# Patient Record
Sex: Female | Born: 1955
Health system: Southern US, Community
[De-identification: ages and names within clinical notes are randomized; demographics above are authoritative.]

## PROBLEM LIST (undated history)

## (undated) DIAGNOSIS — E079 Disorder of thyroid, unspecified: Secondary | ICD-10-CM

## (undated) HISTORY — PX: METATARSAL OSTEOTOMY WITH BUNIONECTOMY: SHX5662

## (undated) HISTORY — PX: TUBAL LIGATION: SHX77

## (undated) HISTORY — PX: NASAL SINUS SURGERY: SHX719

## (undated) HISTORY — PX: NEURECTOMY FOOT: SUR888

## (undated) HISTORY — DX: Disorder of thyroid, unspecified: E07.9

## (undated) HISTORY — PX: LAPAROSCOPIC OOPHERECTOMY: SHX6507

## (undated) HISTORY — PX: APPENDECTOMY: SHX54

## (undated) HISTORY — PX: BREAST SURGERY: SHX581

## (undated) HISTORY — PX: AUGMENTATION MAMMAPLASTY: SUR837

## (undated) HISTORY — PX: HAMMER TOE SURGERY: SHX385

## (undated) HISTORY — PX: AIKEN OSTEOTOMY: SHX6331

---

## 2000-07-17 ENCOUNTER — Encounter: Admission: RE | Admit: 2000-07-17 | Discharge: 2000-07-17 | Payer: Self-pay | Admitting: Sports Medicine

## 2000-07-17 ENCOUNTER — Encounter: Payer: Self-pay | Admitting: Sports Medicine

## 2000-12-02 ENCOUNTER — Emergency Department (HOSPITAL_COMMUNITY): Admission: EM | Admit: 2000-12-02 | Discharge: 2000-12-02 | Payer: Self-pay | Admitting: *Deleted

## 2000-12-04 ENCOUNTER — Other Ambulatory Visit: Admission: RE | Admit: 2000-12-04 | Discharge: 2000-12-04 | Payer: Self-pay | Admitting: Family Medicine

## 2000-12-07 ENCOUNTER — Encounter: Payer: Self-pay | Admitting: Family Medicine

## 2000-12-07 ENCOUNTER — Ambulatory Visit (HOSPITAL_COMMUNITY): Admission: RE | Admit: 2000-12-07 | Discharge: 2000-12-07 | Payer: Self-pay | Admitting: Family Medicine

## 2001-02-02 ENCOUNTER — Ambulatory Visit (HOSPITAL_BASED_OUTPATIENT_CLINIC_OR_DEPARTMENT_OTHER): Admission: RE | Admit: 2001-02-02 | Discharge: 2001-02-02 | Payer: Self-pay | Admitting: Orthopedic Surgery

## 2002-02-11 ENCOUNTER — Other Ambulatory Visit: Admission: RE | Admit: 2002-02-11 | Discharge: 2002-02-11 | Payer: Self-pay | Admitting: Family Medicine

## 2002-02-15 ENCOUNTER — Ambulatory Visit (HOSPITAL_COMMUNITY): Admission: RE | Admit: 2002-02-15 | Discharge: 2002-02-15 | Payer: Self-pay | Admitting: Family Medicine

## 2002-02-15 ENCOUNTER — Encounter: Payer: Self-pay | Admitting: Family Medicine

## 2002-09-20 ENCOUNTER — Encounter: Payer: Self-pay | Admitting: Family Medicine

## 2002-09-20 ENCOUNTER — Encounter: Admission: RE | Admit: 2002-09-20 | Discharge: 2002-09-20 | Payer: Self-pay | Admitting: Family Medicine

## 2002-09-30 ENCOUNTER — Encounter (INDEPENDENT_AMBULATORY_CARE_PROVIDER_SITE_OTHER): Payer: Self-pay | Admitting: Specialist

## 2002-09-30 ENCOUNTER — Ambulatory Visit (HOSPITAL_COMMUNITY): Admission: RE | Admit: 2002-09-30 | Discharge: 2002-09-30 | Payer: Self-pay | Admitting: Obstetrics and Gynecology

## 2002-10-10 ENCOUNTER — Encounter: Payer: Self-pay | Admitting: Obstetrics and Gynecology

## 2002-10-10 ENCOUNTER — Ambulatory Visit (HOSPITAL_COMMUNITY): Admission: RE | Admit: 2002-10-10 | Discharge: 2002-10-10 | Payer: Self-pay | Admitting: Obstetrics and Gynecology

## 2003-11-08 ENCOUNTER — Emergency Department (HOSPITAL_COMMUNITY): Admission: AD | Admit: 2003-11-08 | Discharge: 2003-11-08 | Payer: Self-pay | Admitting: Family Medicine

## 2003-11-17 ENCOUNTER — Encounter: Admission: RE | Admit: 2003-11-17 | Discharge: 2003-11-17 | Payer: Self-pay | Admitting: Family Medicine

## 2004-09-07 ENCOUNTER — Other Ambulatory Visit: Admission: RE | Admit: 2004-09-07 | Discharge: 2004-09-07 | Payer: Self-pay | Admitting: Family Medicine

## 2005-02-11 ENCOUNTER — Other Ambulatory Visit: Admission: RE | Admit: 2005-02-11 | Discharge: 2005-02-11 | Payer: Self-pay | Admitting: Family Medicine

## 2005-05-21 ENCOUNTER — Emergency Department (HOSPITAL_COMMUNITY): Admission: EM | Admit: 2005-05-21 | Discharge: 2005-05-21 | Payer: Self-pay | Admitting: Family Medicine

## 2005-07-02 ENCOUNTER — Emergency Department (HOSPITAL_COMMUNITY): Admission: EM | Admit: 2005-07-02 | Discharge: 2005-07-02 | Payer: Self-pay | Admitting: Emergency Medicine

## 2005-09-19 ENCOUNTER — Ambulatory Visit (HOSPITAL_COMMUNITY): Admission: RE | Admit: 2005-09-19 | Discharge: 2005-09-19 | Payer: Self-pay | Admitting: Gastroenterology

## 2006-04-11 ENCOUNTER — Other Ambulatory Visit: Admission: RE | Admit: 2006-04-11 | Discharge: 2006-04-11 | Payer: Self-pay | Admitting: Family Medicine

## 2007-01-24 ENCOUNTER — Encounter: Admission: RE | Admit: 2007-01-24 | Discharge: 2007-01-24 | Payer: Self-pay | Admitting: Family Medicine

## 2007-06-28 ENCOUNTER — Other Ambulatory Visit: Admission: RE | Admit: 2007-06-28 | Discharge: 2007-06-28 | Payer: Self-pay | Admitting: Family Medicine

## 2008-08-21 ENCOUNTER — Other Ambulatory Visit: Admission: RE | Admit: 2008-08-21 | Discharge: 2008-08-21 | Payer: Self-pay | Admitting: Family Medicine

## 2009-02-17 ENCOUNTER — Encounter: Admission: RE | Admit: 2009-02-17 | Discharge: 2009-02-17 | Payer: Self-pay | Admitting: Otolaryngology

## 2011-04-29 NOTE — H&P (Signed)
NAME:  Jane [name], Jane [name]                        ACCOUNT NO.:  [id number]   MEDICAL RECORD NO.:  [id number]                   PATIENT TYPE:  AMB   LOCATION:  SDC                                  FACILITY:  [facility]   PHYSICIAN:  Rawleigh Rode DICTATOR                    DATE OF BIRTH:  05/13/1956   DATE OF ADMISSION:  09/30/2002  DATE OF DISCHARGE:                                HISTORY & PHYSICAL   HISTORY OF PRESENT ILLNESS:  This is [name] 55 year old, gravida 3, para 2,  abortus 1, who presented on September 24, 2002, complaining of left lower  quadrant discomfort of greater than two years' duration. She has been  followed for 20 years with [name] diagnosis of irritable bowel syndrome. For the  last two years, she has been followed by her family practice physician to  which she has complained about intermittent left lower quadrant discomfort.  She has had two ultrasounds in the past two years and brought copies of each  to me.  The pain she describes is intermittent in nature and correlates  perhaps to both ovulation and to her menstrual period. She has minimal  dysmenorrhea.  She denies any dyspareunia or history of infertility.  She  describes the pain specifically in her left lower quadrant and points to her  inguinal area.  She states that this is sufficiently debilitating to warrant  surgical intervention.   MEDICATIONS:  Lexapro.   PAST MEDICAL HISTORY:  Negative.   PAST SURGICAL HISTORY:  Appendectomy in 1987.  Sinus surgery in 1993.  Tubal  ligation in 1991.  Breast augmentation in 1995.  Achilles repair in 2002.   ALLERGIES:  SULFA.   FAMILY HISTORY:  Positive for lung cancer and [name] brain tumor.   SOCIAL HISTORY:  The patient denies the use of tobacco. She drinks alcohol  socially.  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  She has some complaints of  depression as well.   PHYSICAL EXAMINATION:  GENERAL:  [name] well-developed, well-nourished, pleasant  white female in no acute distress.  VITAL  SIGNS:  Afebrile, vital signs stable.  SKIN: Warm and dry without lesions.  LYMPHS: There is no supraclavicular, cervical, or inguinal adenopathy.  HEENT: Normocephalic.  NECK:  Supple without thyromegaly.  CHEST: Lungs are clear.  HEART:  Regular rate and rhythm without murmurs, rubs, or gallops.  RECTAL:  Deferred.  ABDOMEN:  Soft and nontender without masses or organomegaly.  Bowel sounds  are active.  MUSCULOSKELETAL:  Full range of motion without edema, cyanosis, or  tenderness.  PELVIC: External genitalia is within normal limits. Vagina and cervix  without gross lesions.  Bimanual examination reveals the uterus to be  approximately 9 x 5 x 4 cm and no adnexal masses are palpable. There is  definite point tenderness in the area of the left uterine cornu.  I can  appreciate no obvious adnexal masses.  RECTOVAGINAL: Confirms.   Ultrasound was performed on September 26, 2002, and revealed [name] 1.2 cm echo-  free, smoothly marginated cyst of the right adnexa.  The uterus itself was  noted to be approximately 8 x 5 x 5 cm.  GC and Chlamydia cultures were  negative. CBC revealed [name] white blood cell count of 6.7.  Hemoglobin was  13.6.  Sed rate was 6.   IMPRESSION:  Left lower quadrant discomfort with point tenderness in the  vicinity of the left uterine cornu.  Differential includes fibroid,  endometriosis, adnexal mass, primary GI.  Doubt musculoskeletal, hernia, or  neoplasm.   PLAN:  Diagnostic/operative laparoscopy.  Risks, benefits, complications,  and alternatives were fully discussed with the patient.  Possibility of  obtaining pre GI consultation discussed and declined.  Possibility of  performing unilateral salpingo-oophorectomy discussed and accepted.  Possibility of exploratory laparotomy discussed and accepted.  Questions  invited and answered.                                               Jane [name] DICTATOR    DD/MEDQ  D:  09/29/2002  T:  09/29/2002  Job:  [id number]

## 2011-04-29 NOTE — Op Note (Signed)
[facility]  Patient:    [name], [name]                       MRN: [id number] Proc. Date: 02/02/01 Adm. Date:  [id number] Attending:  Colbert Ewing                           Operative Report  PREOPERATIVE DIAGNOSES: 1. Right heel chronic Achilles tendinitis with partial tearing and detachment. 2. Prominent posterior superior os calcis with pre-Achilles bursitis.  POSTOPERATIVE DIAGNOSES: 1. Right heel chronic Achilles tendinitis with partial tearing and detachment. 2. Prominent posterior superior os calcis with pre-Achilles bursitis.  PROCEDURES: 1. Exploration, debridement, repair Achilles tendon. 2. Removal posterior superior os calcis spur at pre-Achilles bursa.  SURGEON:  Loreta Ave, M.D.  ASSISTANT:  Arlys John D. Petrarca, P.A.-C.  ANESTHESIA:  General.  ESTIMATED BLOOD LOSS:  Minimal.  TOURNIQUET TIME:  45 minutes.  SPECIMENS:  None.  CULTURES:  None.  COMPLICATIONS:  None.  DRESSING:  Soft compressive with bulky dressing and splint.  DESCRIPTION OF PROCEDURE:  Patient brought to the operating room and placed on the operating table in supine position.  After adequate anesthesia had been obtained, turned to a prone position, tourniquet applied upper aspect of the right leg.  Prepped and draped in the usual sterile fashion.  Exsanguinated with elevation and Esmarch, tourniquet inflated to 300 mmHg.  Straight incision along the lateral aspect of the Achilles tendon near the attachment, extending down along the lateral wall of the os calcis.  Skin and subcutaneous tissue divided.  The Achilles tendon exposed.  Markedly thickened, chronically inflamed peritenon over the distal 2 cm of tendon, all of this debrided back to healthy tissue.  Evidence of intrasubstance partial tearing near the attachment on the lateral half of the tendon.  The tendon was opened longitudinally.  All of the areas of mucinous degeneration and  the area of partial tearing debrided back to healthy tissue.  More than 80% of the Achilles attachment still maintained.  As soon as the tendon was incised longitudinally, I entered into a very swollen, inflamed pre-Achilles bursa. This was excised in its entirety.  Accessing through the tendon defect and lateral to it, I removed all of the prominent posterior superior os calcis spur back to a nice, smooth border, confirmed with fluoroscopic guidance.  The wound was then irrigated.  We were able to repair the Achilles with side-to-side approximation with Vicryl, maintaining good longitudinal continuity and bypassing the area of partial tearing for repair.  I could bring her to a plantargrade position easily without undue tension.  The skin and subcutaneous tissue were closed with Vicryl and nylon.  Margins of the wound were injected with Marcaine.  Sterile compressive dressing and splint applied.  Returned to supine position after tourniquet deflated and removed. Anesthesia reversed, brought to recovery room.  Tolerated the surgery well, no complications. DD:  02/02/01 TD:  02/03/01 Job: [id number]

## 2011-04-29 NOTE — Op Note (Signed)
[name], NEU              ACCOUNT NO.:  [id number]   MEDICAL RECORD NO.:  [id number]          PATIENT TYPE:  AMB   LOCATION:  ENDO                         FACILITY:  [facility]   PHYSICIAN:  Petra Kuba, M.D.    DATE OF BIRTH:  [birth year]   DATE OF PROCEDURE:  09/19/2005  DATE OF DISCHARGE:                                 OPERATIVE REPORT   PROCEDURE:  Colonoscopy.   INDICATIONS:  Lower abdominal pain, abnormal CAT scan, family history of  colon polyps. Consent was signed after risks, benefits, methods, options  thoroughly discussed in the office.   MEDICINES USED:  Fentanyl 100 mcg, Versed 10 milligrams.   PROCEDURE:  Rectal inspection pertinent for external hemorrhoids, small  digital exam was negative. Video pediatric adjustable colonoscope was  inserted and with mild difficulty due to a tortuous long looping colon,  requiring various abdominal pressures, but no position changes, we were able  to advance to the cecum. No abnormalities were seen on insertion. The cecum  was identified by the appendiceal orifice and ileocecal valve.  In fact, the  scope was inserted a short ways in the terminal ileum which was normal.  Photodocumentation was obtained. Scope was slowly withdrawn. Prep was  adequate. There was minimal liquid stool that required washing and  suctioning.  Slow withdrawal through the colon, no abnormalities were seen,  specifically no polyps, tumors, masses or diverticula. Once back in the  rectum, anorectal pull-through and retroflexion confirmed some small  hemorrhoids. Scope was straightened and readvanced short ways up the left  side of the colon.  Air was suctioned, scope removed. The patient tolerated  the procedure well. There was no obvious immediate complication.   ENDOSCOPIC DIAGNOSES:  1.  Internal-external small hemorrhoids.  2.  Tortuous long looping colon.  3.  Otherwise within normal limits to the terminal ileum.   PLAN:  Recheck colon  screening in 5 years. Continue workup with an EGD for  her longstanding belching and upper tract symptoms.  Might consider a small  bowel follow-through next.  Will try some Robinul since the Librax helps but  makes her too tired and also consider a laparoscopy and lysis of adhesions,  most likely cause of pain.           ______________________________  Petra Kuba, M.D.     MEM/MEDQ  D:  09/19/2005  T:  09/19/2005  Job:  [id number]   cc:   Fayrene Fearing A. Ashley Royalty, M.D.  Fax: [phone]   Quita Skye. Artis Flock, M.D.  Fax: [phone]

## 2011-04-29 NOTE — Op Note (Signed)
[name], [name]              ACCOUNT NO.:  [id number]   MEDICAL RECORD NO.:  [id number]          PATIENT TYPE:  AMB   LOCATION:  ENDO                         FACILITY:  [facility]   PHYSICIAN:  Petra Kuba, M.D.    DATE OF BIRTH:  1956-11-14   DATE OF PROCEDURE:  09/19/2005  DATE OF DISCHARGE:                                 OPERATIVE REPORT   PROCEDURE:  EGD.   INDICATIONS:  Upper tract symptoms longstanding, family history of  Barrett's.  Consent was signed after risks, benefits, methods, options  thoroughly discussed multiple times in the past.   ADDITIONAL MEDICINES FOR THIS PROCEDURE:  2 mg Versed only.   PROCEDURE:  The video endoscope was inserted by direct vision. She did have  a small hiatal hernia with some minimal distal inflammation but no signs of  Barrett's.  Scope passed in the stomach advanced through a normal antrum,  normal pylorus, into a normal duodenal bulb and around the C-loop to a  normal second portion of the duodenum. Scope was withdrawn back to the bulb  and a good look there ruled out abnormalities in that location.  The scope  was withdrawn back the stomach and retroflexed.  High in the cardia, hiatal  hernia was confirmed. Angularis, fundus, lesser and greater curve were  normal on retroflexed visualization. Straight visualization of the stomach  did not reveal any additional findings. The air was suctioned, scope slowly  withdrawn.  Again a good look at the esophagus was normal except for some  minimal distal inflammation.  Scope was removed. The patient tolerated the  procedure well. There was no obvious immediate complication.   1.  Small hiatal hernia with minimal distal inflammation.  2.  Otherwise normal EGD.   PLAN:  Pump inhibitors p.r.n. as she is doing is fine.  Consider small-bowel  follow-through next or laparoscope. Please see colon dictation for other  recommendations.  Happy to see back p.r.n. or in 6 weeks to recheck   symptoms.   September 18, 2005           ______________________________  Petra Kuba, M.D.     MEM/MEDQ  D:  09/19/2005  T:  09/19/2005  Job:  [id number]   cc:   Fayrene Fearing A. Ashley Royalty, M.D.  Fax: [phone]   Quita Skye. Artis Flock, M.D.  Fax: [phone]

## 2011-04-29 NOTE — Op Note (Signed)
NAME:  [name], [name]                        ACCOUNT NO.:  [id number]   MEDICAL RECORD NO.:  [id number]                   PATIENT TYPE:  AMB   LOCATION:  SDC                                  FACILITY:  [facility]   PHYSICIAN:  James A. Ashley Royalty, M.D.             DATE OF BIRTH:  November 21, 1956   DATE OF PROCEDURE:  09/30/2002  DATE OF DISCHARGE:                                 OPERATIVE REPORT   PREOPERATIVE DIAGNOSIS:  Left lower quadrant discomfort with point  tenderness in the vicinity of the left uterine cornu -- fibroid,  endometriosis, adnexal mass, primary gastrointestinal.  Doubt  musculoskeletal etiology, hernia, or neoplasm.   POSTOPERATIVE DIAGNOSES:  1. Left lower quadrant discomfort with point tenderness in the vicinity of     the left uterine cornu -- fibroid, endometriosis, adnexal mass, primary     gastrointestinal.  Doubt musculoskeletal etiology, hernia, or neoplasm.  2. Right ovarian cyst of 1 cm in greatest diameter, probably physiologic.  3. Adhesions of the left adnexa to the left pelvic sidewall.   PROCEDURE:  1. Diagnostic/operative laparoscopy.  2. Left salpingo-oophorectomy.   SURGEON:  Rudy Jew. Ashley Royalty, M.D.   ANESTHESIA:  General.   ESTIMATED BLOOD LOSS:  Less than 25 cc.   COMPLICATIONS:  None.   PACKS AND DRAINS:  None.   DESCRIPTION OF PROCEDURE:  The patient was taken to the operating room and  placed in the dorsal supine position.  After adequate general endotracheal  anesthesia was administered, she was placed in the lithotomy position and  prepped and draped in the usual manner for abdominal surgery.  A posterior  weighted retractor was placed per vagina.  The anterior lip of the cervix  was grasped with a single-tooth tenaculum.  A Jelco uterine manipulator was  placed per cervix and held in place with the tenaculum.  The bladder was  drained completely with a red rubber catheter.   A 1.2 cm infraumbilical incision was made in the transverse  plane.  The  subcutaneous tissues were sharply and bluntly dissected down to the fascia,  which was elevated with hemostats and entered atraumatically with Metzenbaum  scissors.  The fascia was extended laterally.  The underlying peritoneum was  identified and elevated with hemostats.  It was entered atraumatically with  Metzenbaum scissors.  Next the open laparoscopic trocar was placed into the  abdominal cavity.  It was held in place with stay sutures of 0 Vicryl.  The  laparoscope was placed.  There was no evidence of any trauma.  Pneumoperitoneum was then created with CO2 and maintained throughout the  procedure.  Next a 5 mm trocar was placed in the left lower quadrant  suprapubically.  Transillumination and indirect visualization techniques  were employed for optimum placement.  The pelvis was thoroughly surveyed.  The uterus was normal size, shape, and contour without evidence of any  endometriosis or fibroids.  The fallopian tubes  were interrupted  bilaterally, consistent with the known tubal sterilization procedure.  The  right ovary was normal size, shape, and contour with an apparent 1 cm  physiologic cyst.  The left ovary was normal size, shape, and contour;  however, it was adherent to the left pelvic sidewall initially.   Next a 5 mm suprapubic trocar was placed in the right lower quadrant.  Transillumination and direct visualization techniques were used.  Cytologic  washings were obtained with the Nezhat suction irrigator and submitted to  pathology for cytologic studies.  The remainder of the pelvis survey was  conducted.  Appropriate photographs were obtained.  The peritoneal surfaces  were smooth and glistening.  There were no inappropriate adhesions other  than those from the left adnexa to the left pelvic sidewall.   In view of the patient's intense discomfort in the vicinity of the left  adnexal area, the decision was made to proceed with left salpingo-   oophorectomy.  The tripolar cautery was introduced and the left adnexa was  released from its vascular supply.  The ureter was noted to be well below  the plane of dissection.  The tripolar cautery was employed, taking serial  bites of the mesosalpinx and proximal fallopian tube.  Ultimately the  specimen was delivered successfully and placed into the cul-de-sac for later  retrieval.  Hemostasis was noted.  Appropriate __________ were cleaned.  Next the left inferior port was extended to a 1 cm diameter.  The 10 mm  trocar was placed successfully through this incision using direct  visualization.  The EndoCatch was placed into the abdominal cavity and the  specimen placed in the bag.  The specimen was brought out through the  inferior incision intact in the bag.  The incision had to be extended  slightly in order to accomplish successful delivery of the specimen.  The  left lower quadrant incision's fascia was then closed with 0 Vicryl in a  running fashion.  The superior incision was closed at the level of the  fascia with 0 Vicryl in an interrupted fashion.  The right inferior incision  was closed with 0 Vicryl in an interrupted fashion.  The skin of all three  incisions was closed with 3-0 chromic in subcuticular fashion.  Hemostasis  was noted.  The vaginal instruments were removed, hemostasis was noted, and  the procedure terminated.   The patient tolerated the procedure fairly well and was returned to the  recovery room in good condition.                                                    James A. Ashley Royalty, M.D.    JAM/MEDQ  D:  09/30/2002  T:  09/30/2002  Job:  [id number]

## 2011-11-08 ENCOUNTER — Other Ambulatory Visit (HOSPITAL_COMMUNITY)
Admission: RE | Admit: 2011-11-08 | Discharge: 2011-11-08 | Disposition: A | Discharge status: Home / Self Care | Payer: BC Managed Care – PPO | Source: Ambulatory Visit | Attending: Obstetrics and Gynecology | Admitting: Obstetrics and Gynecology

## 2011-11-08 DIAGNOSIS — Z01419 Encounter for gynecological examination (general) (routine) without abnormal findings: Secondary | ICD-10-CM | POA: Insufficient documentation

## 2012-04-25 ENCOUNTER — Encounter: Payer: Self-pay | Admitting: Sports Medicine

## 2012-05-23 ENCOUNTER — Encounter: Payer: Self-pay | Admitting: Sports Medicine

## 2012-05-23 ENCOUNTER — Ambulatory Visit (INDEPENDENT_AMBULATORY_CARE_PROVIDER_SITE_OTHER): Payer: BC Managed Care – PPO | Admitting: Sports Medicine

## 2012-05-23 VITALS — BP 116/75 | HR 43 | Ht 66.0 in | Wt 130.0 lb

## 2012-05-23 DIAGNOSIS — G5750 Tarsal tunnel syndrome, unspecified lower limb: Secondary | ICD-10-CM

## 2012-05-23 DIAGNOSIS — M21611 Bunion of right foot: Secondary | ICD-10-CM | POA: Insufficient documentation

## 2012-05-23 DIAGNOSIS — M21619 Bunion of unspecified foot: Secondary | ICD-10-CM

## 2012-05-23 HISTORY — DX: Tarsal tunnel syndrome, unspecified lower limb: G57.50

## 2012-05-23 HISTORY — DX: Bunion of right foot: M21.611

## 2012-05-23 MED ORDER — GABAPENTIN 300 MG PO CAPS: 300.0000 mg | ORAL_CAPSULE | Freq: Every day | ORAL | Status: DC

## 2012-05-23 NOTE — Assessment & Plan Note (Signed)
I does see any evidence of Morton's neuroma  I do see ultrasound evidence of tarsal tunnel and she did have a positive exam for this  arch support seems to really help  We added a metatarsal pad to her current sandals which felt better  We will start her on gabapentin at nighttime and recheck in 1 month

## 2012-05-23 NOTE — Patient Instructions (Signed)
Take gabapentin 1 tab at bed time.  We have plenty of room to increase the dose.  Bring your other shoes to your next appointment for padding/help with this foot pain.  Come back in about 1 month.

## 2012-05-23 NOTE — Progress Notes (Signed)
Patient ID: Jillyn Hidden, female   DOB: 22-Jan-1956, 56 y.o.   MRN: [id number]  HPI: Patient of Dr. Margaretha Sheffield diagnosed with Morton's neuroma here for orthotics today.  Saw Draper 6 weeks ago.  Painful right big toe joint for years.  Was getting injections ~ 1x/yr years ago.  This spring the pain became very bad in bottom of lateral 3 toes.  Since appt with Margaretha Sheffield, had pedicure and big toe was manipulated and has been very painful since that time.  + numbness/tinlging over plantar aspect of lateral 3 toes on right, sometimes entire bottom of right foot.    H/o R bunionectomy 11 years ago.    PE: Filed Vitals:   05/23/12 1045  BP: 116/75  Pulse: 43   Gen: NAD, pleasant  Right foot: + tenderness over right great toe +MTB bossing Collapsed arch, right worse than left + hammertoes (right 4th and 5th toes) + Tinel's sign over right tarsal tunnel  Neg squeeze test Neg quarter test + Mulder's only at 4th/5th interspace  [facility] ultrasound Able to identify digital nerves between the interspaces of metatarsals 2 through 5 All of these nerves are of normal caliber and there is no sign of a neuroma The tibial nerve in the tarsal tunnel is thickened with a volume of 0.14 cm2

## 2012-06-26 ENCOUNTER — Ambulatory Visit: Payer: BC Managed Care – PPO | Admitting: Sports Medicine

## 2012-08-23 ENCOUNTER — Other Ambulatory Visit: Payer: Self-pay | Admitting: Dermatology

## 2012-11-12 ENCOUNTER — Other Ambulatory Visit (HOSPITAL_COMMUNITY)
Admission: RE | Admit: 2012-11-12 | Discharge: 2012-11-12 | Disposition: A | Discharge status: Home / Self Care | Payer: BC Managed Care – PPO | Source: Ambulatory Visit | Attending: Obstetrics and Gynecology | Admitting: Obstetrics and Gynecology

## 2012-11-12 ENCOUNTER — Other Ambulatory Visit: Payer: Self-pay | Admitting: Obstetrics and Gynecology

## 2012-11-12 DIAGNOSIS — Z01419 Encounter for gynecological examination (general) (routine) without abnormal findings: Secondary | ICD-10-CM | POA: Insufficient documentation

## 2013-04-04 ENCOUNTER — Ambulatory Visit (INDEPENDENT_AMBULATORY_CARE_PROVIDER_SITE_OTHER): Payer: BC Managed Care – PPO | Admitting: Psychology

## 2013-04-04 DIAGNOSIS — F331 Major depressive disorder, recurrent, moderate: Secondary | ICD-10-CM

## 2013-04-18 ENCOUNTER — Ambulatory Visit (INDEPENDENT_AMBULATORY_CARE_PROVIDER_SITE_OTHER): Payer: BC Managed Care – PPO | Admitting: Psychology

## 2013-04-18 DIAGNOSIS — F331 Major depressive disorder, recurrent, moderate: Secondary | ICD-10-CM

## 2013-05-02 ENCOUNTER — Ambulatory Visit (INDEPENDENT_AMBULATORY_CARE_PROVIDER_SITE_OTHER): Payer: BC Managed Care – PPO | Admitting: Psychology

## 2013-05-02 DIAGNOSIS — F331 Major depressive disorder, recurrent, moderate: Secondary | ICD-10-CM

## 2013-05-15 ENCOUNTER — Ambulatory Visit: Payer: BC Managed Care – PPO | Admitting: Psychology

## 2013-05-16 ENCOUNTER — Other Ambulatory Visit: Payer: Self-pay | Admitting: Dermatology

## 2013-05-27 ENCOUNTER — Ambulatory Visit (INDEPENDENT_AMBULATORY_CARE_PROVIDER_SITE_OTHER): Payer: BC Managed Care – PPO | Admitting: Psychology

## 2013-05-27 DIAGNOSIS — F331 Major depressive disorder, recurrent, moderate: Secondary | ICD-10-CM

## 2013-06-24 ENCOUNTER — Ambulatory Visit (INDEPENDENT_AMBULATORY_CARE_PROVIDER_SITE_OTHER): Payer: BC Managed Care – PPO | Admitting: Psychology

## 2013-06-24 DIAGNOSIS — F331 Major depressive disorder, recurrent, moderate: Secondary | ICD-10-CM

## 2013-07-12 ENCOUNTER — Ambulatory Visit (INDEPENDENT_AMBULATORY_CARE_PROVIDER_SITE_OTHER): Payer: BC Managed Care – PPO | Admitting: Psychology

## 2013-07-12 DIAGNOSIS — F331 Major depressive disorder, recurrent, moderate: Secondary | ICD-10-CM

## 2013-08-02 ENCOUNTER — Ambulatory Visit (INDEPENDENT_AMBULATORY_CARE_PROVIDER_SITE_OTHER): Payer: BC Managed Care – PPO | Admitting: Psychology

## 2013-08-02 DIAGNOSIS — F331 Major depressive disorder, recurrent, moderate: Secondary | ICD-10-CM

## 2013-12-25 ENCOUNTER — Other Ambulatory Visit: Payer: Self-pay | Admitting: Dermatology

## 2014-03-20 ENCOUNTER — Ambulatory Visit (INDEPENDENT_AMBULATORY_CARE_PROVIDER_SITE_OTHER): Payer: BC Managed Care – PPO

## 2014-03-20 ENCOUNTER — Encounter: Payer: Self-pay | Admitting: Podiatry

## 2014-03-20 ENCOUNTER — Ambulatory Visit (INDEPENDENT_AMBULATORY_CARE_PROVIDER_SITE_OTHER): Payer: BC Managed Care – PPO | Admitting: Podiatry

## 2014-03-20 VITALS — BP 107/72 | HR 45 | Resp 16 | Ht 66.0 in | Wt 132.0 lb

## 2014-03-20 DIAGNOSIS — G5760 Lesion of plantar nerve, unspecified lower limb: Secondary | ICD-10-CM

## 2014-03-20 DIAGNOSIS — M2010 Hallux valgus (acquired), unspecified foot: Secondary | ICD-10-CM

## 2014-03-20 DIAGNOSIS — M779 Enthesopathy, unspecified: Secondary | ICD-10-CM

## 2014-03-20 DIAGNOSIS — M7660 Achilles tendinitis, unspecified leg: Secondary | ICD-10-CM

## 2014-03-20 MED ORDER — TRIAMCINOLONE ACETONIDE 10 MG/ML IJ SUSP
10.0000 mg | Freq: Once | INTRAMUSCULAR | Status: AC
  Administered 2014-03-20: 10 mg

## 2014-03-20 NOTE — Progress Notes (Signed)
Subjective:     Patient ID: [name], female   DOB: 28-Nov-1956, 58 y.o.   MRN: [id number]  Foot Pain  Toe Pain    patient presents with pain in the forefoot right and structural bunion that was fixed 12 years ago by Dr. Eulah PontMurphy that has returned and pain in the posterior aspect of the left heel which is intensified over the last few months. States she was diagnosed with tarsal tunnel syndrome but she's not sure of that right on her right foot and she feels like the toes hurt and it hurts in between the toes  Review of Systems  All other systems reviewed and are negative.      Objective:   Physical Exam  Nursing note and vitals reviewed. Constitutional: She is oriented to person, place, and time.  Cardiovascular: Intact distal pulses.   Musculoskeletal: Normal range of motion.  Neurological: She is oriented to person, place, and time.  Skin: Skin is warm.   neurovascular status found to be intact with muscle strength adequate and range of motion within normal limits. Negative Tinel's sign was noted and I didn't note there's discomfort in the lesser MPJs right keratotic lesion on the distal medial aspect second toe right deviation of the big toe against the second toe right hyperostosis with redness first metatarsal right and shooting pain third interspace right foot. 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 I'm focusing on 2 conditions today and did a careful injection of the lateral Achilles explaining risk first and then did a neural lysis injection of the right third interspace with a purified dehydrated alcohol solution and Marcaine. Patient  will test the right foot with different types of shoes and will be seen back again to reevaluate in 2 weeks and was given instructions to be very careful of the left foot and I did explain the risk of the procedure

## 2014-03-20 NOTE — Patient Instructions (Addendum)
Bunion (Hallux Valgus) A bony bump (protrusion) on the inside of the foot, at the base of the first toe, is called a bunion (hallux valgus). A bunion causes the first toe to angle toward the other toes. SYMPTOMS   A bony bump on the inside of the foot, causing an outward turning of the first toe. It may also overlap the second toe.  Thickening of the skin (callus) over the bony bump.  Fluid buildup under the callus. Fluid may become red, tender, and swollen (inflamed) with constant irritation or pressure.  Foot pain and stiffness. CAUSES  Many causes exist, including:  Inherited from your family (genetics).  Injury (trauma) forcing the first toe into a position in which it overlaps other toes.  Bunions are also associated with wearing shoes that have a narrow toe box (pointy shoes). RISK INCREASES WITH:  Family history of foot abnormalities, especially bunions.  Arthritis.  Narrow shoes, especially high heels. PREVENTION  Wear shoes with a wide toe box.  Avoid shoes with high heels.  Wear a small pad between the big toe and second toe.  Maintain proper conditioning:  Foot and ankle flexibility.  Muscle strength and endurance. PROGNOSIS  With proper treatment, bunions can typically be cured. Occasionally, surgery is required.  RELATED COMPLICATIONS   Infection of the bunion.  Arthritis of the first toe.  Risks of surgery, including infection, bleeding, injury to nerves (numb toe), recurrent bunion, overcorrection (toe points inward), arthritis of the big toe, big toe pointing upward, and bone not healing. TREATMENT  Treatment first consists of stopping the activities that aggravate the pain, taking pain medicines, and icing to reduce inflammation and pain. Wear shoes with a wide toe box. Shoes can be modified by a shoe repair person to relieve pressure on the bunion, especially if you cannot find shoes with a wide enough toe box. You may also place a pad with the  center cut out in your shoe, to reduce pressure on the bunion. Sometimes, an arch support (orthotic) may reduce pressure on the bunion and alleviate the symptoms. Stretching and strengthening exercises for the muscles of the foot may be useful. You may choose to wear a brace or pad at night to hold the big toe away from the second toe. If non-surgical treatments are not successful, surgery may be needed. Surgery involves removing the overgrown tissue and correcting the position of the first toe, by realigning the bones. Bunion surgery is typically performed on an outpatient basis, meaning you can go home the same day as surgery. The surgery may involve cutting the mid portion of the bone of the first toe, or just cutting and repairing (reconstructing) the ligaments and soft tissues around the first toe.  MEDICATION   If pain medicine is needed, nonsteroidal anti-inflammatory medicines, such as aspirin and ibuprofen, or other minor pain relievers, such as acetaminophen, are often recommended.  Do not take pain medicine for 7 days before surgery.  Prescription pain relievers are usually only prescribed after surgery. Use only as directed and only as much as you need.  Ointments applied to the skin may be helpful. HEAT AND COLD  Cold treatment (icing) relieves pain and reduces inflammation. Cold treatment should be applied for 10 to 15 minutes every 2 to 3 hours for inflammation and pain and immediately after any activity that aggravates your symptoms. Use ice packs or an ice massage.  Heat treatment may be used prior to performing the stretching and strengthening activities prescribed by your   caregiver, physical therapist, or athletic trainer. Use a heat pack or a warm soak. SEEK MEDICAL CARE IF:   Symptoms get worse or do not improve in 2 weeks, despite treatment.  After surgery, you develop fever, increasing pain, redness, swelling, drainage of fluids, bleeding, or increasing warmth around the  surgical area.  New, unexplained symptoms develop. (Drugs used in treatment may produce side effects.) Document Released: 11/28/2005 Document Revised: 02/20/2012 Document Reviewed: 03/12/2009 [facility] Patient Information 2014 [place], [place].  Hammer Toes Hammer toes is a condition in which one or more of your toes is permanently flexed. CAUSES  This happens when a muscle imbalance or abnormal bone length makes your small toes buckle. This causes the toe joint to contract and the strong cord-like bands that attach muscles to the bones (tendons) in your toes to shorten.  SIGNS AND SYMPTOMS  Common symptoms of flexible hammer toes include:   A build up of skin cells (Corns). Corns occur where boney bumps come in frequent contact with hard surfaces. For example, where your shoes press and rub.  Irritation.  Inflammation.  Pain.  Limited motion in your toes DIAGNOSIS  Hammer toes are diagnosed through a physical exam of your toes.During the exam, your health care provider may try to reproduce your symptoms by manipulating your foot. Often, x-ray exams are done to determine the degree of deformity and to make sure that the cause is not a fracture.  TREATMENT  Hammer toes can be treated with corrective surgery. There are several types of surgical procedures that can treat hammer toes. The most common procedures include:  Arthroplasty A portion of the joint is surgically removed and your toe is straightened. The gap fills in with fibrous tissue. This procedure helps treat pain and deformity and helps restore function.  Fusion Cartilage between the two bones of the affected joint is taken out and the bones fuse together into one longer bone. This helps keep your toe stable and reduces pain but leaves your toe stiff, yet straight.  Implantation A portion of your bone is removed and replaced with an implant to restore motion.  Flexor tendon transfers This procedure repositions the tendons  that curl the toes down (flexor tendons). This may be done to release the deforming force that causes your toe to buckle. Several of these procedures require fixing your toe with a pin that is visible at the tip of your toe. The pin keeps the toe straight during healing. Your health care provider will remove the pin usually within 4 8 weeks after the procedure.  Document Released: 11/25/2000 Document Revised: 09/18/2013 Document Reviewed: 08/05/2013 [facility] Patient Information 2014 [place], [place].   Achilles Tendinitis  with Rehab Achilles tendinitis is a disorder of the Achilles tendon. The Achilles tendon connects the large calf muscles (Gastrocnemius and Soleus) to the heel bone (calcaneus). This tendon is sometimes called the heel cord. It is important for pushing-off and standing on your toes and is important for walking, running, or jumping. Tendinitis is often caused by overuse and repetitive microtrauma. SYMPTOMS  Pain, tenderness, swelling, warmth, and redness may occur over the Achilles tendon even at rest.  Pain with pushing off, or flexing or extending the ankle.  Pain that is worsened after or during activity. CAUSES   Overuse sometimes seen with rapid increase in exercise programs or in sports requiring running and jumping.  Poor physical conditioning (strength and flexibility or endurance).  Running sports, especially training running down hills.  Inadequate warm-up before practice  or play or failure to stretch before participation.  Injury to the tendon. PREVENTION   Warm up and stretch before practice or competition.  Allow time for adequate rest and recovery between practices and competition.  Keep up conditioning.  Keep up ankle and leg flexibility.  Improve or keep muscle strength and endurance.  Improve cardiovascular fitness.  Use proper technique.  Use proper equipment (shoes, skates).  To help prevent recurrence, taping, protective strapping, or  an adhesive bandage may be recommended for several weeks after healing is complete. PROGNOSIS   Recovery may take weeks to several months to heal.  Longer recovery is expected if symptoms have been prolonged.  Recovery is usually quicker if the inflammation is due to a direct blow as compared with overuse or sudden strain. RELATED COMPLICATIONS   Healing time will be prolonged if the condition is not correctly treated. The injury must be given plenty of time to heal.  Symptoms can reoccur if activity is resumed too soon.  Untreated, tendinitis may increase the risk of tendon rupture requiring additional time for recovery and possibly surgery. TREATMENT   The first treatment consists of rest anti-inflammatory medication, and ice to relieve the pain.  Stretching and strengthening exercises after resolution of pain will likely help reduce the risk of recurrence. Referral to a physical therapist or athletic trainer for further evaluation and treatment may be helpful.  A walking boot or cast may be recommended to rest the Achilles tendon. This can help break the cycle of inflammation and microtrauma.  Arch supports (orthotics) may be prescribed or recommended by your caregiver as an adjunct to therapy and rest.  Surgery to remove the inflamed tendon lining or degenerated tendon tissue is rarely necessary and has shown less than predictable results. MEDICATION   Nonsteroidal anti-inflammatory medications, such as aspirin and ibuprofen, may be used for pain and inflammation relief. Do not take within 7 days before surgery. Take these as directed by your caregiver. Contact your caregiver immediately if any bleeding, stomach upset, or signs of allergic reaction occur. Other minor pain relievers, such as acetaminophen, may also be used.  Pain relievers may be prescribed as necessary by your caregiver. Do not take prescription pain medication for longer than 4 to 7 days. Use only as directed and  only as much as you need.  Cortisone injections are rarely indicated. Cortisone injections may weaken tendons and predispose to rupture. It is better to give the condition more time to heal than to use them. HEAT AND COLD  Cold is used to relieve pain and reduce inflammation for acute and chronic Achilles tendinitis. Cold should be applied for 10 to 15 minutes every 2 to 3 hours for inflammation and pain and immediately after any activity that aggravates your symptoms. Use ice packs or an ice massage.  Heat may be used before performing stretching and strengthening activities prescribed by your caregiver. Use a heat pack or a warm soak. SEEK MEDICAL CARE IF:  Symptoms get worse or do not improve in 2 weeks despite treatment.  New, unexplained symptoms develop. Drugs used in treatment may produce side effects.  EXERCISES:  RANGE OF MOTION (ROM) AND STRETCHING EXERCISES - Achilles Tendinitis  These exercises may help you when beginning to rehabilitate your injury. Your symptoms may resolve with or without further involvement from your physician, physical therapist or athletic trainer. While completing these exercises, remember:   Restoring tissue flexibility helps normal motion to return to the joints. This allows healthier, less  painful movement and activity.  An effective stretch should be held for at least 30 seconds.  A stretch should never be painful. You should only feel a gentle lengthening or release in the stretched tissue.  STRETCH  Gastroc, Standing   Place hands on wall.  Extend right / left leg, keeping the front knee somewhat bent.  Slightly point your toes inward on your back foot.  Keeping your right / left heel on the floor and your knee straight, shift your weight toward the wall, not allowing your back to arch.  You should feel a gentle stretch in the right / left calf. Hold this position for 10 seconds. Repeat 3 times. Complete this stretch 2 times per  day.  STRETCH  Soleus, Standing   Place hands on wall.  Extend right / left leg, keeping the other knee somewhat bent.  Slightly point your toes inward on your back foot.  Keep your right / left heel on the floor, bend your back knee, and slightly shift your weight over the back leg so that you feel a gentle stretch deep in your back calf.  Hold this position for 10 seconds. Repeat 3 times. Complete this stretch 2 times per day.  STRETCH  Gastrocsoleus, Standing  Note: This exercise can place a lot of stress on your foot and ankle. Please complete this exercise only if specifically instructed by your caregiver.   Place the ball of your right / left foot on a step, keeping your other foot firmly on the same step.  Hold on to the wall or a rail for balance.  Slowly lift your other foot, allowing your body weight to press your heel down over the edge of the step.  You should feel a stretch in your right / left calf.  Hold this position for 10 seconds.  Repeat this exercise with a slight bend in your knee. Repeat 3 times. Complete this stretch 2 times per day.   STRENGTHENING EXERCISES - Achilles Tendinitis These exercises may help you when beginning to rehabilitate your injury. They may resolve your symptoms with or without further involvement from your physician, physical therapist or athletic trainer. While completing these exercises, remember:   Muscles can gain both the endurance and the strength needed for everyday activities through controlled exercises.  Complete these exercises as instructed by your physician, physical therapist or athletic trainer. Progress the resistance and repetitions only as guided.  You may experience muscle soreness or fatigue, but the pain or discomfort you are trying to eliminate should never worsen during these exercises. If this pain does worsen, stop and make certain you are following the directions exactly. If the pain is still present after  adjustments, discontinue the exercise until you can discuss the trouble with your clinician.  STRENGTH - Plantar-flexors   Sit with your right / left leg extended. Holding onto both ends of a rubber exercise band/tubing, loop it around the ball of your foot. Keep a slight tension in the band.  Slowly push your toes away from you, pointing them downward.  Hold this position for 10 seconds. Return slowly, controlling the tension in the band/tubing. Repeat 3 times. Complete this exercise 2 times per day.   STRENGTH - Plantar-flexors   Stand with your feet shoulder width apart. Steady yourself with a wall or table using as little support as needed.  Keeping your weight evenly spread over the width of your feet, rise up on your toes.*  Hold this position for  10 seconds. Repeat 3 times. Complete this exercise 2 times per day.  *If this is too easy, shift your weight toward your right / left leg until you feel challenged. Ultimately, you may be asked to do this exercise with your right / left foot only.  STRENGTH  Plantar-flexors, Eccentric  Note: This exercise can place a lot of stress on your foot and ankle. Please complete this exercise only if specifically instructed by your caregiver.   Place the balls of your feet on a step. With your hands, use only enough support from a wall or rail to keep your balance.  Keep your knees straight and rise up on your toes.  Slowly shift your weight entirely to your right / left toes and pick up your opposite foot. Gently and with controlled movement, lower your weight through your right / left foot so that your heel drops below the level of the step. You will feel a slight stretch in the back of your calf at the end position.  Use the healthy leg to help rise up onto the balls of both feet, then lower weight only on the right / left leg again. Build up to 15 repetitions. Then progress to 3 consecutive sets of 15 repetitions.*  After completing the  above exercise, complete the same exercise with a slight knee bend (about 30 degrees). Again, build up to 15 repetitions. Then progress to 3 consecutive sets of 15 repetitions.* Perform this exercise 2 times per day.  *When you easily complete 3 sets of 15, your physician, physical therapist or athletic trainer may advise you to add resistance by wearing a backpack filled with additional weight.  STRENGTH - Plantar Flexors, Seated   Sit on a chair that allows your feet to rest flat on the ground. If necessary, sit at the edge of the chair.  Keeping your toes firmly on the ground, lift your right / left heel as far as you can without increasing any discomfort in your ankle. Repeat 3 times. Complete this exercise 2 times a day.   Achilles Tendinitis  with Rehab Achilles tendinitis is a disorder of the Achilles tendon. The Achilles tendon connects the large calf muscles (Gastrocnemius and Soleus) to the heel bone (calcaneus). This tendon is sometimes called the heel cord. It is important for pushing-off and standing on your toes and is important for walking, running, or jumping. Tendinitis is often caused by overuse and repetitive microtrauma. SYMPTOMS  Pain, tenderness, swelling, warmth, and redness may occur over the Achilles tendon even at rest.  Pain with pushing off, or flexing or extending the ankle.  Pain that is worsened after or during activity. CAUSES   Overuse sometimes seen with rapid increase in exercise programs or in sports requiring running and jumping.  Poor physical conditioning (strength and flexibility or endurance).  Running sports, especially training running down hills.  Inadequate warm-up before practice or play or failure to stretch before participation.  Injury to the tendon. PREVENTION   Warm up and stretch before practice or competition.  Allow time for adequate rest and recovery between practices and competition.  Keep up conditioning.  Keep up ankle  and leg flexibility.  Improve or keep muscle strength and endurance.  Improve cardiovascular fitness.  Use proper technique.  Use proper equipment (shoes, skates).  To help prevent recurrence, taping, protective strapping, or an adhesive bandage may be recommended for several weeks after healing is complete. PROGNOSIS   Recovery may take weeks to several months  to heal.  Longer recovery is expected if symptoms have been prolonged.  Recovery is usually quicker if the inflammation is due to a direct blow as compared with overuse or sudden strain. RELATED COMPLICATIONS   Healing time will be prolonged if the condition is not correctly treated. The injury must be given plenty of time to heal.  Symptoms can reoccur if activity is resumed too soon.  Untreated, tendinitis may increase the risk of tendon rupture requiring additional time for recovery and possibly surgery. TREATMENT   The first treatment consists of rest anti-inflammatory medication, and ice to relieve the pain.  Stretching and strengthening exercises after resolution of pain will likely help reduce the risk of recurrence. Referral to a physical therapist or athletic trainer for further evaluation and treatment may be helpful.  A walking boot or cast may be recommended to rest the Achilles tendon. This can help break the cycle of inflammation and microtrauma.  Arch supports (orthotics) may be prescribed or recommended by your caregiver as an adjunct to therapy and rest.  Surgery to remove the inflamed tendon lining or degenerated tendon tissue is rarely necessary and has shown less than predictable results. MEDICATION   Nonsteroidal anti-inflammatory medications, such as aspirin and ibuprofen, may be used for pain and inflammation relief. Do not take within 7 days before surgery. Take these as directed by your caregiver. Contact your caregiver immediately if any bleeding, stomach upset, or signs of allergic reaction  occur. Other minor pain relievers, such as acetaminophen, may also be used.  Pain relievers may be prescribed as necessary by your caregiver. Do not take prescription pain medication for longer than 4 to 7 days. Use only as directed and only as much as you need.  Cortisone injections are rarely indicated. Cortisone injections may weaken tendons and predispose to rupture. It is better to give the condition more time to heal than to use them. HEAT AND COLD  Cold is used to relieve pain and reduce inflammation for acute and chronic Achilles tendinitis. Cold should be applied for 10 to 15 minutes every 2 to 3 hours for inflammation and pain and immediately after any activity that aggravates your symptoms. Use ice packs or an ice massage.  Heat may be used before performing stretching and strengthening activities prescribed by your caregiver. Use a heat pack or a warm soak. SEEK MEDICAL CARE IF:  Symptoms get worse or do not improve in 2 weeks despite treatment.  New, unexplained symptoms develop. Drugs used in treatment may produce side effects.  EXERCISES:  RANGE OF MOTION (ROM) AND STRETCHING EXERCISES - Achilles Tendinitis  These exercises may help you when beginning to rehabilitate your injury. Your symptoms may resolve with or without further involvement from your physician, physical therapist or athletic trainer. While completing these exercises, remember:   Restoring tissue flexibility helps normal motion to return to the joints. This allows healthier, less painful movement and activity.  An effective stretch should be held for at least 30 seconds.  A stretch should never be painful. You should only feel a gentle lengthening or release in the stretched tissue.  STRETCH  Gastroc, Standing   Place hands on wall.  Extend right / left leg, keeping the front knee somewhat bent.  Slightly point your toes inward on your back foot.  Keeping your right / left heel on the floor and your  knee straight, shift your weight toward the wall, not allowing your back to arch.  You should feel a gentle  stretch in the right / left calf. Hold this position for 10 seconds. Repeat 3 times. Complete this stretch 2 times per day.  STRETCH  Soleus, Standing   Place hands on wall.  Extend right / left leg, keeping the other knee somewhat bent.  Slightly point your toes inward on your back foot.  Keep your right / left heel on the floor, bend your back knee, and slightly shift your weight over the back leg so that you feel a gentle stretch deep in your back calf.  Hold this position for 10 seconds. Repeat 3 times. Complete this stretch 2 times per day.  STRETCH  Gastrocsoleus, Standing  Note: This exercise can place a lot of stress on your foot and ankle. Please complete this exercise only if specifically instructed by your caregiver.   Place the ball of your right / left foot on a step, keeping your other foot firmly on the same step.  Hold on to the wall or a rail for balance.  Slowly lift your other foot, allowing your body weight to press your heel down over the edge of the step.  You should feel a stretch in your right / left calf.  Hold this position for 10 seconds.  Repeat this exercise with a slight bend in your knee. Repeat 3 times. Complete this stretch 2 times per day.   STRENGTHENING EXERCISES - Achilles Tendinitis These exercises may help you when beginning to rehabilitate your injury. They may resolve your symptoms with or without further involvement from your physician, physical therapist or athletic trainer. While completing these exercises, remember:   Muscles can gain both the endurance and the strength needed for everyday activities through controlled exercises.  Complete these exercises as instructed by your physician, physical therapist or athletic trainer. Progress the resistance and repetitions only as guided.  You may experience muscle soreness or  fatigue, but the pain or discomfort you are trying to eliminate should never worsen during these exercises. If this pain does worsen, stop and make certain you are following the directions exactly. If the pain is still present after adjustments, discontinue the exercise until you can discuss the trouble with your clinician.  STRENGTH - Plantar-flexors   Sit with your right / left leg extended. Holding onto both ends of a rubber exercise band/tubing, loop it around the ball of your foot. Keep a slight tension in the band.  Slowly push your toes away from you, pointing them downward.  Hold this position for 10 seconds. Return slowly, controlling the tension in the band/tubing. Repeat 3 times. Complete this exercise 2 times per day.   STRENGTH - Plantar-flexors   Stand with your feet shoulder width apart. Steady yourself with a wall or table using as little support as needed.  Keeping your weight evenly spread over the width of your feet, rise up on your toes.*  Hold this position for 10 seconds. Repeat 3 times. Complete this exercise 2 times per day.  *If this is too easy, shift your weight toward your right / left leg until you feel challenged. Ultimately, you may be asked to do this exercise with your right / left foot only.  STRENGTH  Plantar-flexors, Eccentric  Note: This exercise can place a lot of stress on your foot and ankle. Please complete this exercise only if specifically instructed by your caregiver.   Place the balls of your feet on a step. With your hands, use only enough support from a wall or rail to  keep your balance.  Keep your knees straight and rise up on your toes.  Slowly shift your weight entirely to your right / left toes and pick up your opposite foot. Gently and with controlled movement, lower your weight through your right / left foot so that your heel drops below the level of the step. You will feel a slight stretch in the back of your calf at the end  position.  Use the healthy leg to help rise up onto the balls of both feet, then lower weight only on the right / left leg again. Build up to 15 repetitions. Then progress to 3 consecutive sets of 15 repetitions.*  After completing the above exercise, complete the same exercise with a slight knee bend (about 30 degrees). Again, build up to 15 repetitions. Then progress to 3 consecutive sets of 15 repetitions.* Perform this exercise 2 times per day.  *When you easily complete 3 sets of 15, your physician, physical therapist or athletic trainer may advise you to add resistance by wearing a backpack filled with additional weight.  STRENGTH - Plantar Flexors, Seated   Sit on a chair that allows your feet to rest flat on the ground. If necessary, sit at the edge of the chair.  Keeping your toes firmly on the ground, lift your right / left heel as far as you can without increasing any discomfort in your ankle. Repeat 3 times. Complete this exercise 2 times a day.

## 2014-03-20 NOTE — Progress Notes (Signed)
   Subjective:    Patient ID: [name], female    DOB: September 12, 1956, 58 y.o.   MRN: [id number]  HPI Comments: "I have a few things going on"  Patient c/o of multiple foot problems. She has a painful 1st MPJ and 2nd toe right foot. The area at MPJ gets red and swollen sometimes. She had bunion surgery 12 years ago. She is developing a corn medial side of 2nd toe right. Toes tend to overlap. Shoes uncomfortable.  Also, she has aching posterior heel left. She does have a large lump.     Review of Systems  All other systems reviewed and are negative.      Objective:   Physical Exam        Assessment & Plan:

## 2014-03-27 ENCOUNTER — Ambulatory Visit (INDEPENDENT_AMBULATORY_CARE_PROVIDER_SITE_OTHER): Payer: BC Managed Care – PPO | Admitting: Podiatry

## 2014-03-27 ENCOUNTER — Encounter: Payer: Self-pay | Admitting: Podiatry

## 2014-03-27 VITALS — BP 129/70 | HR 44 | Resp 16

## 2014-03-27 DIAGNOSIS — M216X9 Other acquired deformities of unspecified foot: Secondary | ICD-10-CM

## 2014-03-27 DIAGNOSIS — G5760 Lesion of plantar nerve, unspecified lower limb: Secondary | ICD-10-CM

## 2014-03-27 DIAGNOSIS — M2010 Hallux valgus (acquired), unspecified foot: Secondary | ICD-10-CM

## 2014-03-27 DIAGNOSIS — M2040 Other hammer toe(s) (acquired), unspecified foot: Secondary | ICD-10-CM

## 2014-03-27 NOTE — Patient Instructions (Signed)
Pre-Operative Instructions  Congratulations, you have decided to take an important step to improving your quality of life.  You can be assured that the doctors of [facility] will be with you every step of the way.  1. Plan to be at the surgery center/hospital at least 1 (one) hour prior to your scheduled time unless otherwise directed by the surgical center/hospital staff.  You must have a responsible adult accompany you, remain during the surgery and drive you home.  Make sure you have directions to the surgical center/hospital and know how to get there on time. 2. For hospital based surgery you will need to obtain a history and physical form from your family physician within 1 month prior to the date of surgery- we will give you a form for you primary physician.  3. We make every effort to accommodate the date you request for surgery.  There are however, times where surgery dates or times have to be moved.  We will contact you as soon as possible if a change in schedule is required.   4. No Aspirin/Ibuprofen for one week before surgery.  If you are on aspirin, any non-steroidal anti-inflammatory medications (Mobic, Aleve, Ibuprofen) you should stop taking it 7 days prior to your surgery.  You make take Tylenol  For pain prior to surgery.  5. Medications- If you are taking daily heart and blood pressure medications, seizure, reflux, allergy, asthma, anxiety, pain or diabetes medications, make sure the surgery center/hospital is aware before the day of surgery so they may notify you which medications to take or avoid the day of surgery. 6. No food or drink after midnight the night before surgery unless directed otherwise by surgical center/hospital staff. 7. No alcoholic beverages 24 hours prior to surgery.  No smoking 24 hours prior to or 24 hours after surgery. 8. Wear loose pants or shorts- loose enough to fit over bandages, boots, and casts. 9. No slip on shoes, sneakers are best. 10. Bring  your boot with you to the surgery center/hospital.  Also bring crutches or a walker if your physician has prescribed it for you.  If you do not have this equipment, it will be provided for you after surgery. 11. If you have not been contracted by the surgery center/hospital by the day before your surgery, call to confirm the date and time of your surgery. 12. Leave-time from work may vary depending on the type of surgery you have.  Appropriate arrangements should be made prior to surgery with your employer. 13. Prescriptions will be provided immediately following surgery by your doctor.  Have these filled as soon as possible after surgery and take the medication as directed. 14. Remove nail polish on the operative foot. 15. Wash the night before surgery.  The night before surgery wash the foot and leg well with the antibacterial soap provided and water paying special attention to beneath the toenails and in between the toes.  Rinse thoroughly with water and dry well with a towel.  Perform this wash unless told not to do so by your physician.  Enclosed: 1 Ice pack (please put in freezer the night before surgery)   1 Hibiclens skin cleaner   Pre-op Instructions  If you have any questions regarding the instructions, do not hesitate to call our office.  [place]: [address] [place], [place] [address] [phone]  [place]: [address] [place], [place] [address] [phone]  [place]: [address]  [place], [place] [address] [phone]  Dr. Richard   Tuchman DPM, Dr. Norman Regal DPM Dr. Richard Sikora DPM, Dr. M. Todd Hyatt DPM, Dr. Kathryn Egerton DPM 

## 2014-03-27 NOTE — Progress Notes (Signed)
Subjective:     Patient ID: [name], female   DOB: [birth year], 58 y.o.   MRN: [id number]  HPI patient presents with pain in the right foot underneath the metatarsal bones between the toe was in between the third and fourth toe of the right foot. Patient states it's been shooting it very hard for her to ambulate and it's been getting worse over the last few months. States the injection that we did gave her some relief of the pain on the outside of her foot but she continued to have trouble with the bunion and the toe and the position of her toes. Left heel is doing better after injection   Review of Systems     Objective:   Physical Exam Neurovascular status intact digits well perfused with significant structural bunion deformity right deviation of the hallux against the second toe slight distal keratotic lesion second toe elevation of the second third and fourth toes with rigid contracture and exquisite discomfort in the second and third metatarsophalangeal joints of the right foot. Patient's third interspace shows positive Yancey FlemingsMulder sign was shooting pains occurring. Patient's left foot shows moderate structural bunion deformity    Assessment:     HAV deformity right despite surgery done years ago by Dr. Eulah PontMurphy hallux interphalangeus deformity right and digital deformity with elevation digits 234 right foot. 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 We discussed treatment options including orthotics neuro lysis treatment wider shoes padding or surgery. Patient has opted for surgery understanding that this is a very difficult issue and that there is no long-term guarantees that we will be able to solve her problems or relieve her pain. She is completely aware of this and wants surgery and at this time I allowed her to read a consent  form [facility] discussing Austin osteotomy with an Akin osteotomy with wire digital fusion digits 234 with wider neuroma excision and shortening osteotomies of the second and third metatarsal bones right. I did explain that there is a very good chance that the digits we'll not purchase the ground postoperatively and that there may be chronic pain in the MPJs and that there is no guarantee this will cure her problems. I also explained that this is revisional surgery and I will not be able to get the bunion completely corrected but I do think I will be able to put it in a better functioning position. Patient wants surgery and understands all above and signs consent form after extensive review and is scheduled for outpatient surgery understanding total recovery. We'll take 6 months to one year. I did dispensed air fracture walker for the postoperative period and instructed on continues to it prior to surgery

## 2014-04-03 DIAGNOSIS — M2040 Other hammer toe(s) (acquired), unspecified foot: Secondary | ICD-10-CM

## 2014-04-03 DIAGNOSIS — M2010 Hallux valgus (acquired), unspecified foot: Secondary | ICD-10-CM

## 2014-04-03 DIAGNOSIS — G5750 Tarsal tunnel syndrome, unspecified lower limb: Secondary | ICD-10-CM

## 2014-04-03 DIAGNOSIS — M21549 Acquired clubfoot, unspecified foot: Secondary | ICD-10-CM

## 2014-04-08 ENCOUNTER — Telehealth: Payer: Self-pay | Admitting: *Deleted

## 2014-04-08 MED ORDER — MEPERIDINE HCL 50 MG PO TABS: 50.0000 mg | ORAL_TABLET | ORAL | Status: DC | PRN

## 2014-04-08 MED ORDER — PROMETHAZINE HCL 25 MG PO TABS: 25.0000 mg | ORAL_TABLET | Freq: Three times a day (TID) | ORAL | Status: DC | PRN

## 2014-04-08 NOTE — Telephone Encounter (Signed)
I called and informed the patient that she can come by to pick up her prescription.

## 2014-04-08 NOTE — Telephone Encounter (Signed)
I had surgery Thursday.  I'm going to run out of pain medicine this afternoon.  I'm still having quite a bit of pain.  Want to see about getting this filled between now and when I come in on Thursday.

## 2014-04-10 ENCOUNTER — Ambulatory Visit (INDEPENDENT_AMBULATORY_CARE_PROVIDER_SITE_OTHER): Payer: BC Managed Care – PPO

## 2014-04-10 ENCOUNTER — Ambulatory Visit (INDEPENDENT_AMBULATORY_CARE_PROVIDER_SITE_OTHER): Payer: BC Managed Care – PPO | Admitting: Podiatry

## 2014-04-10 ENCOUNTER — Encounter: Payer: Self-pay | Admitting: Podiatry

## 2014-04-10 VITALS — BP 149/98 | HR 60 | Resp 16

## 2014-04-10 DIAGNOSIS — M2010 Hallux valgus (acquired), unspecified foot: Secondary | ICD-10-CM

## 2014-04-10 DIAGNOSIS — M2040 Other hammer toe(s) (acquired), unspecified foot: Secondary | ICD-10-CM

## 2014-04-10 MED ORDER — HYDROCODONE-ACETAMINOPHEN 10-325 MG PO TABS: 1.0000 | ORAL_TABLET | Freq: Three times a day (TID) | ORAL | Status: DC | PRN

## 2014-04-21 ENCOUNTER — Ambulatory Visit (INDEPENDENT_AMBULATORY_CARE_PROVIDER_SITE_OTHER): Payer: BC Managed Care – PPO | Admitting: Podiatry

## 2014-04-21 ENCOUNTER — Ambulatory Visit (INDEPENDENT_AMBULATORY_CARE_PROVIDER_SITE_OTHER): Payer: BC Managed Care – PPO

## 2014-04-21 ENCOUNTER — Encounter: Payer: Self-pay | Admitting: Podiatry

## 2014-04-21 VITALS — BP 112/68 | HR 72 | Resp 16

## 2014-04-21 DIAGNOSIS — G8918 Other acute postprocedural pain: Secondary | ICD-10-CM

## 2014-04-21 DIAGNOSIS — M2010 Hallux valgus (acquired), unspecified foot: Secondary | ICD-10-CM

## 2014-04-21 DIAGNOSIS — M2040 Other hammer toe(s) (acquired), unspecified foot: Secondary | ICD-10-CM

## 2014-04-21 NOTE — Progress Notes (Signed)
Subjective:     Patient ID: [name], female   DOB: 24-Jun-1956, 58 y.o.   MRN: [id number]  HPI patient states I'm doing pretty well with still discomfort if him on them a lot and I have a lot of trouble taking the medication and I have stopped all pain medication   Review of Systems     Objective:   Physical Exam Neurovascular status intact with negative Homans sign noted and well-healing surgical site right with stitches intact digits in good alignment and pins in place second third and fourth toe with good alignment of the big toe. Patient has been somewhat active and in keeping a close eye to make sure bowel movement is not occurring   Assessment:     Doing well from surgery of the right foot with good alignment of the pins and wound edges well coapted    Plan:     Stitches were removed and x-ray reviewed with patient. Sterile dressing applied and reappoint for us to recheck again in 2 weeks for pin removal earlier if necessary

## 2014-04-21 NOTE — Progress Notes (Signed)
   Subjective:    Patient ID: [name], female    DOB: [birth year], 58 y.o.   MRN: [id number]  HPI  POV #2, suture removal, mild pain noted, improvement in pain since surgery. Pain medications are causing n/v.   Review of Systems     Objective:   Physical Exam        Assessment & Plan:

## 2014-04-22 ENCOUNTER — Ambulatory Visit
Admission: RE | Admit: 2014-04-22 | Discharge: 2014-04-22 | Disposition: A | Discharge status: Home / Self Care | Payer: BC Managed Care – PPO | Source: Ambulatory Visit | Attending: Family Medicine | Admitting: Family Medicine

## 2014-04-22 ENCOUNTER — Other Ambulatory Visit: Payer: Self-pay | Admitting: Family Medicine

## 2014-04-22 DIAGNOSIS — R1011 Right upper quadrant pain: Secondary | ICD-10-CM

## 2014-04-24 ENCOUNTER — Other Ambulatory Visit: Payer: Self-pay | Admitting: Gastroenterology

## 2014-04-24 ENCOUNTER — Ambulatory Visit
Admission: RE | Admit: 2014-04-24 | Discharge: 2014-04-24 | Disposition: A | Discharge status: Home / Self Care | Payer: BC Managed Care – PPO | Source: Ambulatory Visit | Attending: Gastroenterology | Admitting: Gastroenterology

## 2014-04-24 DIAGNOSIS — R7402 Elevation of levels of lactic acid dehydrogenase (LDH): Secondary | ICD-10-CM

## 2014-04-24 DIAGNOSIS — R82998 Other abnormal findings in urine: Secondary | ICD-10-CM

## 2014-04-24 DIAGNOSIS — R740 Nonspecific elevation of levels of transaminase and lactic acid dehydrogenase [LDH]: Secondary | ICD-10-CM

## 2014-04-24 DIAGNOSIS — R110 Nausea: Secondary | ICD-10-CM

## 2014-04-24 DIAGNOSIS — R109 Unspecified abdominal pain: Secondary | ICD-10-CM

## 2014-04-24 DIAGNOSIS — R945 Abnormal results of liver function studies: Principal | ICD-10-CM

## 2014-04-24 DIAGNOSIS — R932 Abnormal findings on diagnostic imaging of liver and biliary tract: Secondary | ICD-10-CM

## 2014-04-24 DIAGNOSIS — R6889 Other general symptoms and signs: Secondary | ICD-10-CM

## 2014-04-24 DIAGNOSIS — R1011 Right upper quadrant pain: Secondary | ICD-10-CM

## 2014-04-24 DIAGNOSIS — R7401 Elevation of levels of liver transaminase levels: Secondary | ICD-10-CM

## 2014-04-24 DIAGNOSIS — R7989 Other specified abnormal findings of blood chemistry: Secondary | ICD-10-CM

## 2014-04-24 MED ORDER — IOHEXOL 300 MG/ML  SOLN
100.0000 mL | Freq: Once | INTRAMUSCULAR | Status: AC | PRN
  Administered 2014-04-24: 100 mL via INTRAVENOUS

## 2014-04-24 MED ORDER — IOHEXOL 300 MG/ML  SOLN
30.0000 mL | Freq: Once | INTRAMUSCULAR | Status: AC | PRN
  Administered 2014-04-24: 30 mL via ORAL

## 2014-04-30 ENCOUNTER — Encounter (HOSPITAL_COMMUNITY): Payer: Self-pay | Admitting: Emergency Medicine

## 2014-04-30 ENCOUNTER — Ambulatory Visit
Admission: RE | Admit: 2014-04-30 | Discharge: 2014-04-30 | Disposition: A | Discharge status: Home / Self Care | Payer: BC Managed Care – PPO | Source: Ambulatory Visit | Attending: Gastroenterology | Admitting: Gastroenterology

## 2014-04-30 ENCOUNTER — Emergency Department (HOSPITAL_COMMUNITY)
Admission: EM | Admit: 2014-04-30 | Discharge: 2014-04-30 | Disposition: A | Discharge status: Home / Self Care | Payer: BC Managed Care – PPO | Attending: Emergency Medicine | Admitting: Emergency Medicine

## 2014-04-30 DIAGNOSIS — IMO0002 Reserved for concepts with insufficient information to code with codable children: Secondary | ICD-10-CM | POA: Insufficient documentation

## 2014-04-30 DIAGNOSIS — R945 Abnormal results of liver function studies: Principal | ICD-10-CM

## 2014-04-30 DIAGNOSIS — Z9851 Tubal ligation status: Secondary | ICD-10-CM | POA: Insufficient documentation

## 2014-04-30 DIAGNOSIS — R110 Nausea: Secondary | ICD-10-CM

## 2014-04-30 DIAGNOSIS — R82998 Other abnormal findings in urine: Secondary | ICD-10-CM

## 2014-04-30 DIAGNOSIS — Z79899 Other long term (current) drug therapy: Secondary | ICD-10-CM | POA: Insufficient documentation

## 2014-04-30 DIAGNOSIS — R1031 Right lower quadrant pain: Secondary | ICD-10-CM | POA: Insufficient documentation

## 2014-04-30 DIAGNOSIS — Z9089 Acquired absence of other organs: Secondary | ICD-10-CM | POA: Insufficient documentation

## 2014-04-30 DIAGNOSIS — R1011 Right upper quadrant pain: Secondary | ICD-10-CM

## 2014-04-30 DIAGNOSIS — E079 Disorder of thyroid, unspecified: Secondary | ICD-10-CM | POA: Insufficient documentation

## 2014-04-30 DIAGNOSIS — R7989 Other specified abnormal findings of blood chemistry: Secondary | ICD-10-CM

## 2014-04-30 DIAGNOSIS — R748 Abnormal levels of other serum enzymes: Secondary | ICD-10-CM | POA: Insufficient documentation

## 2014-04-30 DIAGNOSIS — R109 Unspecified abdominal pain: Secondary | ICD-10-CM

## 2014-04-30 LAB — URINALYSIS, ROUTINE W REFLEX MICROSCOPIC
Glucose, UA: NEGATIVE mg/dL
Hgb urine dipstick: NEGATIVE
Ketones, ur: NEGATIVE mg/dL
Leukocytes, UA: NEGATIVE
Nitrite: NEGATIVE
Protein, ur: NEGATIVE mg/dL
Specific Gravity, Urine: 1.013 (ref 1.005–1.030)
Urobilinogen, UA: 0.2 mg/dL (ref 0.0–1.0)
pH: 8 (ref 5.0–8.0)

## 2014-04-30 LAB — CBC WITH DIFFERENTIAL/PLATELET
Basophils Absolute: 0 10*3/uL (ref 0.0–0.1)
Basophils Relative: 1 % (ref 0–1)
Eosinophils Absolute: 0.1 10*3/uL (ref 0.0–0.7)
Eosinophils Relative: 2 % (ref 0–5)
HCT: 38 % (ref 36.0–46.0)
Hemoglobin: 13 g/dL (ref 12.0–15.0)
Lymphocytes Relative: 55 % — ABNORMAL HIGH (ref 12–46)
Lymphs Abs: 2.5 10*3/uL (ref 0.7–4.0)
MCH: 32.6 pg (ref 26.0–34.0)
MCHC: 34.2 g/dL (ref 30.0–36.0)
MCV: 95.2 fL (ref 78.0–100.0)
Monocytes Absolute: 0.4 10*3/uL (ref 0.1–1.0)
Monocytes Relative: 8 % (ref 3–12)
Neutro Abs: 1.6 10*3/uL — ABNORMAL LOW (ref 1.7–7.7)
Neutrophils Relative %: 34 % — ABNORMAL LOW (ref 43–77)
Platelets: 306 10*3/uL (ref 150–400)
RBC: 3.99 MIL/uL (ref 3.87–5.11)
RDW: 12.8 % (ref 11.5–15.5)
WBC: 4.6 10*3/uL (ref 4.0–10.5)

## 2014-04-30 LAB — COMPREHENSIVE METABOLIC PANEL
ALT: 242 U/L — ABNORMAL HIGH (ref 0–35)
AST: 141 U/L — ABNORMAL HIGH (ref 0–37)
Albumin: 3.6 g/dL (ref 3.5–5.2)
Alkaline Phosphatase: 177 U/L — ABNORMAL HIGH (ref 39–117)
BUN: 16 mg/dL (ref 6–23)
CO2: 25 mEq/L (ref 19–32)
Calcium: 9.6 mg/dL (ref 8.4–10.5)
Chloride: 102 mEq/L (ref 96–112)
Creatinine, Ser: 0.63 mg/dL (ref 0.50–1.10)
GFR calc Af Amer: >90 mL/min (ref >90)
GFR calc non Af Amer: >90 mL/min (ref >90)
Glucose, Bld: 96 mg/dL (ref 70–99)
Potassium: 4.5 mEq/L (ref 3.7–5.3)
Sodium: 139 mEq/L (ref 137–147)
Total Bilirubin: 2 mg/dL — ABNORMAL HIGH (ref 0.3–1.2)
Total Protein: 6.9 g/dL (ref 6.0–8.3)

## 2014-04-30 LAB — I-STAT TROPONIN, ED: Troponin i, poc: 0.01 ng/mL (ref 0.00–0.08)

## 2014-04-30 LAB — LIPASE, BLOOD: Lipase: 54 U/L (ref 11–59)

## 2014-04-30 MED ORDER — GADOBENATE DIMEGLUMINE 529 MG/ML IV SOLN
12.0000 mL | Freq: Once | INTRAVENOUS | Status: AC | PRN
  Administered 2014-04-30: 12 mL via INTRAVENOUS

## 2014-04-30 MED ORDER — OXYCODONE HCL 5 MG PO TABS: 5.0000 mg | ORAL_TABLET | ORAL | Status: DC | PRN

## 2014-04-30 MED ORDER — ONDANSETRON HCL 4 MG/2ML IJ SOLN
4.0000 mg | Freq: Once | INTRAMUSCULAR | Status: AC
  Administered 2014-04-30: 4 mg via INTRAVENOUS
  Filled 2014-04-30: qty 2

## 2014-04-30 MED ORDER — HYDROMORPHONE HCL PF 1 MG/ML IJ SOLN
1.0000 mg | Freq: Once | INTRAMUSCULAR | Status: AC
  Administered 2014-04-30: 1 mg via INTRAVENOUS
  Filled 2014-04-30: qty 1

## 2014-04-30 NOTE — ED Provider Notes (Signed)
CSN: [id number]     Arrival date & time 04/30/14  [id number] History   First MD Initiated Contact with Patient 04/30/14 1115     Chief Complaint  Patient presents with  . Abdominal Pain     (Consider location/radiation/quality/duration/timing/severity/associated sxs/prior Treatment) HPI Patient presents to the emergency department with mid to right upper abdominal pain.  The patient, states, that she was seen by her GI doctor and found to have elevated liver enzymes, but had a negative CT and ultrasound at the time.  Patient, states, that her GI doctor ordered an MRI with MRCP as scheduled for today at 5:30 the patient, states, that she hadn't a sudden worsening in her pain earlier today and was told to come into the emergency department by her GI Dr.  Patient denies vomiting, diarrhea, weakness, numbness, dizziness, headache, blurred vision, chest pain, shortness of breath, fever, back pain, or syncope.  The patient, states, that his pain has been ongoing over the last several weeks.  She states the pain comes and goes.  Patient, states nothing seems to make her condition, better or worse. Past Medical History  Diagnosis Date  . Thyroid disease    Past Surgical History  Procedure Laterality Date  . Metatarsal osteotomy with bunionectomy    . Aiken osteotomy    . Hammer toe surgery    . Neurectomy foot    . Appendectomy    . Tubal ligation     No family history on file. History  Substance Use Topics  . Smoking status: Never Smoker   . Smokeless tobacco: Never Used  . Alcohol Use: Yes     Comment: occ   OB History   Grav Para Term Preterm Abortions TAB SAB Ect Mult Living                 Review of Systems All other systems negative except as documented in the HPI. All pertinent positives and negatives as reviewed in the HPI.   Allergies  Sulfa antibiotics and Tetanus toxoids  Home Medications   Prior to Admission medications   Medication Sig Start Date End Date Taking?  Authorizing Provider  b complex vitamins tablet Take 1 tablet by mouth daily.   Yes Historical Provider, MD  Cholecalciferol 4000 UNITS CAPS Take 4,000 Units by mouth daily.   Yes Historical Provider, MD  diphenhydrAMINE (BENADRYL) 25 mg capsule Take 25-50 mg by mouth every 6 (six) hours as needed for itching or allergies.   Yes Historical Provider, MD  fluticasone (FLONASE) 50 MCG/ACT nasal spray Place 1 spray into both nostrils daily.   Yes Historical Provider, MD  HYDROcodone-acetaminophen (NORCO) 10-325 MG per tablet Take 1 tablet by mouth every 6 (six) hours as needed for moderate pain.   Yes Historical Provider, MD  hydrocortisone cream 1 % Apply 1 application topically 2 (two) times daily as needed for itching.   Yes Historical Provider, MD  levothyroxine (SYNTHROID, LEVOTHROID) 50 MCG tablet Take 50 mcg by mouth daily.   Yes Historical Provider, MD  Multiple Vitamins-Minerals (MULTIVITAMIN PO) Take 1 tablet by mouth daily.   Yes Historical Provider, MD  Omega-3 Fatty Acids (FISH OIL) 1000 MG CAPS Take 1,000 mg by mouth daily.   Yes Historical Provider, MD   BP 125/96  Pulse 48  Temp(Src) 97.6 F (36.4 C) (Oral)  Resp 18  Wt 125 lb (56.7 kg)  SpO2 98% Physical Exam  Nursing note and vitals reviewed. Constitutional: She appears well-developed and well-nourished. No distress.  HENT:  Head: Normocephalic and atraumatic.  Mouth/Throat: Oropharynx is clear and moist.  Eyes: Pupils are equal, round, and reactive to light.  Neck: Normal range of motion. Neck supple.  Cardiovascular: Normal rate, regular rhythm and normal heart sounds.  Exam reveals no gallop and no friction rub.   No murmur heard. Pulmonary/Chest: Effort normal and breath sounds normal. No respiratory distress.  Abdominal: Soft. Normal appearance and bowel sounds are normal. She exhibits no distension. There is no rigidity, no rebound, no guarding and no CVA tenderness. No hernia.    Skin: Skin is warm and dry. No  erythema.    ED Course  Procedures (including critical care time) Labs Review Labs Reviewed  COMPREHENSIVE METABOLIC PANEL - Abnormal; Notable for the following:    AST 141 (*)    ALT 242 (*)    Alkaline Phosphatase 177 (*)    Total Bilirubin 2.0 (*)    All other components within normal limits  CBC WITH DIFFERENTIAL - Abnormal; Notable for the following:    Neutrophils Relative % 34 (*)    Neutro Abs 1.6 (*)    Lymphocytes Relative 55 (*)    All other components within normal limits  URINALYSIS, ROUTINE W REFLEX MICROSCOPIC - Abnormal; Notable for the following:    Color, Urine AMBER (*)    Bilirubin Urine SMALL (*)    All other components within normal limits  URINE CULTURE  LIPASE, BLOOD  I-STAT TROPOININ, ED    I spoke with Dr. Dulce Sellaroutlaw, from GI, who states, that at the patient's pain is controlled.  She could get it had been imaging done on an outpatient basis.  Felt that her pain was not under control, that she would need to be admitted.  The patient, states, that she is feeling well enough to be discharged and have her study done as an outpatient.  The patient, states, that she will return here for any worsening in her condition.  All questions were answered.  Advised to call her GI Dr. this afternoon or any further instructions.  Patient was given the results of her testing here and all questions were answered    Carlyle DollyChristopher W Jessamy Torosyan, PA-C 04/30/14 1448

## 2014-04-30 NOTE — ED Notes (Signed)
Pt has pain that comes and goes, pain is a 9 when it comes.

## 2014-04-30 NOTE — Discharge Instructions (Signed)
Return here as needed.  Followup and call your GI Dr.

## 2014-04-30 NOTE — ED Notes (Signed)
Pt states one week ago started having mid upper abd pain into back and was seen by Dr. Ewing SchleinMagod and had elevated liver and pancreatic enzymes, negative us.  Started feeling better and enzymes decreased.  Pt states then pain came back this am in waves and continues to radiate into back.  Pt reports nausea/heartburn

## 2014-05-01 LAB — URINE CULTURE
Colony Count: NO GROWTH
Culture: NO GROWTH

## 2014-05-01 NOTE — ED Provider Notes (Signed)
Medical screening examination/treatment/procedure(s) were performed by non-physician practitioner and as supervising physician I was immediately available for consultation/collaboration.      Laray AngerKathleen M Tyran Huser, DO 05/01/14 2135

## 2014-05-06 ENCOUNTER — Encounter: Payer: Self-pay | Admitting: Podiatry

## 2014-05-07 ENCOUNTER — Encounter: Payer: Self-pay | Admitting: Podiatry

## 2014-05-07 ENCOUNTER — Ambulatory Visit: Payer: BC Managed Care – PPO | Admitting: Podiatry

## 2014-05-07 ENCOUNTER — Ambulatory Visit (INDEPENDENT_AMBULATORY_CARE_PROVIDER_SITE_OTHER): Payer: BC Managed Care – PPO

## 2014-05-07 VITALS — BP 104/66 | HR 50 | Resp 16

## 2014-05-07 DIAGNOSIS — M2040 Other hammer toe(s) (acquired), unspecified foot: Secondary | ICD-10-CM

## 2014-05-07 DIAGNOSIS — M2010 Hallux valgus (acquired), unspecified foot: Secondary | ICD-10-CM

## 2014-05-07 MED ORDER — OXYCODONE HCL 5 MG PO TABS: 5.0000 mg | ORAL_TABLET | ORAL | Status: DC | PRN

## 2014-05-07 NOTE — Progress Notes (Signed)
Subjective:     Patient ID: [name], female   DOB: [birth year], 58 y.o.   MRN: [id number]  HPI patient states that she is doing well with her right foot and is excited to get the pins out   Review of Systems     Objective:   Physical Exam Neurovascular status unchanged with excellent alignment of the right foot secondary to extensive   foot surgery with pins and placed wound edges well coapted and good alignment noted Assessment:     Doing well post extensive foot surgery right    Plan:     Pins removed from digits sterile dressings applied x-rays reviewed and gradual return to saw shoe gear over the next few weeks

## 2014-05-11 ENCOUNTER — Emergency Department (HOSPITAL_COMMUNITY)
Admission: EM | Admit: 2014-05-11 | Discharge: 2014-05-11 | Disposition: A | Discharge status: Home / Self Care | Payer: BC Managed Care – PPO | Attending: Emergency Medicine | Admitting: Emergency Medicine

## 2014-05-11 ENCOUNTER — Encounter (HOSPITAL_COMMUNITY): Payer: Self-pay | Admitting: Emergency Medicine

## 2014-05-11 DIAGNOSIS — IMO0002 Reserved for concepts with insufficient information to code with codable children: Secondary | ICD-10-CM | POA: Insufficient documentation

## 2014-05-11 DIAGNOSIS — E079 Disorder of thyroid, unspecified: Secondary | ICD-10-CM | POA: Insufficient documentation

## 2014-05-11 DIAGNOSIS — Z79899 Other long term (current) drug therapy: Secondary | ICD-10-CM | POA: Insufficient documentation

## 2014-05-11 DIAGNOSIS — Z9889 Other specified postprocedural states: Secondary | ICD-10-CM | POA: Insufficient documentation

## 2014-05-11 DIAGNOSIS — M7989 Other specified soft tissue disorders: Secondary | ICD-10-CM

## 2014-05-11 MED ORDER — OXYCODONE HCL 5 MG PO TABS
5.0000 mg | ORAL_TABLET | Freq: Once | ORAL | Status: AC
  Administered 2014-05-11: 5 mg via ORAL
  Filled 2014-05-11: qty 1

## 2014-05-11 MED ORDER — ENOXAPARIN SODIUM 60 MG/0.6ML ~~LOC~~ SOLN
1.0000 mg/kg | Freq: Once | SUBCUTANEOUS | Status: AC
  Administered 2014-05-11: 60 mg via SUBCUTANEOUS
  Filled 2014-05-11: qty 0.6

## 2014-05-11 NOTE — ED Notes (Signed)
Patient has sock on right foot Patient refusing to take sock off for assessment Patient in NAD Awaiting EDP

## 2014-05-11 NOTE — ED Notes (Signed)
MD at bedside. 

## 2014-05-11 NOTE — ED Notes (Signed)
Pt had foot surgery 5 weeks ago, came out of the boot on Thursday, Friday she noticed her right leg to be swollen and tight

## 2014-05-11 NOTE — ED Provider Notes (Signed)
CSN: [id number]     Arrival date & time 05/11/14  1943 History   First MD Initiated Contact with Patient 05/11/14 2006     Chief Complaint  Patient presents with  . Leg Swelling     (Consider location/radiation/quality/duration/timing/severity/associated sxs/prior Treatment) HPI 58 year old female presents with right lower leg swelling over the past 2 days. She had surgery for hammertoes 5 weeks ago and had her pins and cast removed a few days ago. At that time she had no leg swelling. She's not had any increase in pain in her feet. Shenoticed a traumatic leg swelling 2 days ago and has been getting progressively worse. There is no one focal area of pain but she's got pain in her posterior calf. No redness. No chest pain or shortness of breath. She called her surgeon who recommends she come to the ER to rule out DVT.  Past Medical History  Diagnosis Date  . Thyroid disease    Past Surgical History  Procedure Laterality Date  . Metatarsal osteotomy with bunionectomy    . Aiken osteotomy    . Hammer toe surgery    . Neurectomy foot    . Appendectomy    . Tubal ligation     History reviewed. No pertinent family history. History  Substance Use Topics  . Smoking status: Never Smoker   . Smokeless tobacco: Never Used  . Alcohol Use: Yes     Comment: occ   OB History   Grav Para Term Preterm Abortions TAB SAB Ect Mult Living                 Review of Systems  Respiratory: Negative for shortness of breath.   Cardiovascular: Positive for leg swelling. Negative for chest pain.  Musculoskeletal: Negative for joint swelling.  Skin: Negative for color change.  Neurological: Negative for weakness and numbness.  All other systems reviewed and are negative.     Allergies  Sulfa antibiotics and Tetanus toxoids  Home Medications   Prior to Admission medications   Medication Sig Start Date End Date Taking? Authorizing Provider  b complex vitamins tablet Take 1 tablet by mouth  daily.   Yes Historical Provider, MD  Cholecalciferol 4000 UNITS CAPS Take 4,000 Units by mouth daily.   Yes Historical Provider, MD  fluticasone (FLONASE) 50 MCG/ACT nasal spray Place 1 spray into both nostrils daily.   Yes Historical Provider, MD  hydrocortisone cream 1 % Apply 1 application topically 2 (two) times daily as needed for itching.   Yes Historical Provider, MD  ibuprofen (ADVIL,MOTRIN) 200 MG tablet Take 200 mg by mouth every 6 (six) hours as needed (pain).   Yes Historical Provider, MD  levothyroxine (SYNTHROID, LEVOTHROID) 50 MCG tablet Take 50 mcg by mouth daily.   Yes Historical Provider, MD  Multiple Vitamins-Minerals (MULTIVITAMIN PO) Take 1 tablet by mouth daily.   Yes Historical Provider, MD  Omega-3 Fatty Acids (FISH OIL) 1000 MG CAPS Take 1,000 mg by mouth daily.   Yes Historical Provider, MD  oxyCODONE (ROXICODONE) 5 MG immediate release tablet Take 1 tablet (5 mg total) by mouth every 4 (four) hours as needed for severe pain. 05/07/14  Yes Kirstie Peri Regal, DPM   BP 128/72  Pulse 56  Temp(Src) 98.1 F (36.7 C) (Oral)  Resp 19  Ht 5\' 6"  (1.676 m)  Wt 130 lb (58.968 kg)  BMI 20.99 kg/m2  SpO2 95% Physical Exam  Nursing note and vitals reviewed. Constitutional: She is oriented to person, place,  and time. She appears well-developed and well-nourished. No distress.  HENT:  Head: Normocephalic and atraumatic.  Right Ear: External ear normal.  Left Ear: External ear normal.  Nose: Nose normal.  Eyes: Right eye exhibits no discharge. Left eye exhibits no discharge.  Cardiovascular: Normal rate, regular rhythm and normal heart sounds.   Pulmonary/Chest: Effort normal and breath sounds normal.  Abdominal: Soft. She exhibits no distension.  Musculoskeletal:  Right calf tenderness posteriorly. No cords palpated. No erythema or cellulitis. Her right toes have well-appearing sutures without signs of infection. No pitting edema at her right lower leg is diffusely swollen  compared to the left  Neurological: She is alert and oriented to person, place, and time.  Skin: Skin is warm and dry.    ED Course  Procedures (including critical care time) Labs Review Labs Reviewed - No data to display  Imaging Review No results found.   EKG Interpretation None      MDM   Final diagnoses:  Right leg swelling    Patient's isolated leg swelling is concerning for a DVT. [facility] overnight DVT ultrasound. No PE symptoms, normal vital signs. Will give dose of therapeutic lovenox (normal Cr within last 10 days), and give referral for outpatient ultrasound in the AM. Discussed strict return precautions. No tenderness above knee.     Audree CamelScott T Jodi Kappes, MD 05/11/14 [phone]

## 2014-05-11 NOTE — ED Notes (Signed)
Awaiting arrival of Lovenox from Pharmacy

## 2014-05-11 NOTE — Discharge Instructions (Signed)
If you develop worsening pain, swelling, or redness return to the ER for evaluation. If you notice shortness of breath, chest pain or dizziness come back for immediate evaluation. You are to get a DVT ultrasound tomorrow morning at [facility].

## 2014-05-12 ENCOUNTER — Telehealth: Payer: Self-pay | Admitting: *Deleted

## 2014-05-12 ENCOUNTER — Ambulatory Visit (HOSPITAL_COMMUNITY)
Admission: RE | Admit: 2014-05-12 | Discharge: 2014-05-12 | Disposition: A | Discharge status: Home / Self Care | Payer: BC Managed Care – PPO | Source: Ambulatory Visit | Attending: Podiatry | Admitting: Podiatry

## 2014-05-12 DIAGNOSIS — G5750 Tarsal tunnel syndrome, unspecified lower limb: Secondary | ICD-10-CM

## 2014-05-12 DIAGNOSIS — M79609 Pain in unspecified limb: Secondary | ICD-10-CM

## 2014-05-12 DIAGNOSIS — M21611 Bunion of right foot: Secondary | ICD-10-CM

## 2014-05-12 DIAGNOSIS — M7989 Other specified soft tissue disorders: Secondary | ICD-10-CM

## 2014-05-12 DIAGNOSIS — M21619 Bunion of unspecified foot: Secondary | ICD-10-CM | POA: Insufficient documentation

## 2014-05-12 NOTE — Telephone Encounter (Signed)
I spoke to Dr. Charlsie Merles last night because my calves were swollen.  Everthing is fine, no blood clots, had an ultrasound this morning.  Pass that on to him, he asked that I let him know.

## 2014-05-12 NOTE — Progress Notes (Signed)
Right lower extremity venous duplex completed.  Right:  No evidence of DVT, superficial thrombosis, or Baker's cyst.  Left:  Negative for DVT in the common femoral vein.  

## 2014-06-04 ENCOUNTER — Encounter: Payer: Self-pay | Admitting: Podiatry

## 2014-06-04 ENCOUNTER — Ambulatory Visit (INDEPENDENT_AMBULATORY_CARE_PROVIDER_SITE_OTHER): Payer: BC Managed Care – PPO | Admitting: Podiatry

## 2014-06-04 ENCOUNTER — Ambulatory Visit (INDEPENDENT_AMBULATORY_CARE_PROVIDER_SITE_OTHER): Payer: BC Managed Care – PPO

## 2014-06-04 VITALS — BP 110/68 | HR 54 | Resp 16

## 2014-06-04 DIAGNOSIS — M2040 Other hammer toe(s) (acquired), unspecified foot: Secondary | ICD-10-CM

## 2014-06-04 DIAGNOSIS — M216X9 Other acquired deformities of unspecified foot: Secondary | ICD-10-CM

## 2014-06-04 DIAGNOSIS — M2041 Other hammer toe(s) (acquired), right foot: Secondary | ICD-10-CM

## 2014-06-04 MED ORDER — HYDROCODONE-IBUPROFEN 5-200 MG PO TABS: 1.0000 | ORAL_TABLET | Freq: Three times a day (TID) | ORAL | Status: DC | PRN

## 2014-06-05 NOTE — Progress Notes (Signed)
Subjective:     Patient ID: [name], female   DOB: 05/12/1956, [age] y.o.   MRN: [id number]  HPI patient states that her foot seems okay but she can't even look at it and she admits that she's been walking on the outside of her foot   Review of Systems     Objective:   Physical Exam 8 weeks after having extensive foot surgery right with well-healing surgical sites wound edges better coapted well with excellent alignment of the digits but decreased range of motion of the first MPJ secondary to her not utilizing the joint    Assessment:     Review tearing condition and recommended the importance of range of motion exercises for the first MPJ and at this time gradual increase in activity levels    Plan:     Reviewed x-rays and again encouraged her on motion and explained everything is doing very well but she needs to start walking more on her foot. She promises to do that and will be seen back in 8 weeks earlier if any issues should occur

## 2014-07-09 ENCOUNTER — Encounter: Payer: Self-pay | Admitting: *Deleted

## 2014-07-10 ENCOUNTER — Encounter: Payer: Self-pay | Admitting: Cardiology

## 2014-07-10 ENCOUNTER — Ambulatory Visit (INDEPENDENT_AMBULATORY_CARE_PROVIDER_SITE_OTHER): Payer: BC Managed Care – PPO | Admitting: Cardiology

## 2014-07-10 VITALS — BP 102/60 | HR 60 | Ht 66.0 in | Wt 127.0 lb

## 2014-07-10 DIAGNOSIS — I517 Cardiomegaly: Secondary | ICD-10-CM | POA: Insufficient documentation

## 2014-07-10 DIAGNOSIS — R072 Precordial pain: Secondary | ICD-10-CM | POA: Insufficient documentation

## 2014-07-10 DIAGNOSIS — R079 Chest pain, unspecified: Secondary | ICD-10-CM

## 2014-07-10 HISTORY — DX: Precordial pain: R07.2

## 2014-07-10 HISTORY — DX: Cardiomegaly: I51.7

## 2014-07-10 NOTE — Progress Notes (Signed)
HPI The patient presents for evaluation of chest pain and cardiomegaly.  She has no past cardiac history other than a negative POET (Plain Old Exercise Treadmill) many years ago.  She has been a runner for almost 30 years.  She was noted recently on a CT of her abdomen to have cardiomegaly.  I reviewed these films and there is cardiomegaly.  She does have some sensation of chest heaviness.  This is not associated with activity.  It happens at rest.  It is mild and seems to come and go spontaneously.  She does get palpitations but these are at night.  She has insomnia and feels her heart beating fast if she wakes up in the middle of the night.  She has had no syncope or presyncope.  She does have some mild DOE.  However, she has had this only, she thinks, because she has been less active because she had surgery on her foot.  She has been doing some walking but has not been able to do any running.  She does not have swelling, weight pain, PND or orthopnea.    Allergies  Allergen Reactions  . Sulfa Antibiotics     Childhood allergy  . Tetanus Toxoids Rash    Pain and swelling    Current Outpatient Prescriptions  Medication Sig Dispense Refill  . b complex vitamins tablet Take 1 tablet by mouth daily.      . Cholecalciferol 4000 UNITS CAPS Take 4,000 Units by mouth daily.      . fluticasone (FLONASE) 50 MCG/ACT nasal spray Place 1 spray into both nostrils daily.      . Multiple Vitamins-Minerals (MULTIVITAMIN PO) Take 1 tablet by mouth daily.      . Omega-3 Fatty Acids (FISH OIL) 1000 MG CAPS Take 1,000 mg by mouth daily.       No current facility-administered medications for this visit.    Past Medical History  Diagnosis Date  . Thyroid disease     Past Surgical History  Procedure Laterality Date  . Metatarsal osteotomy with bunionectomy    . Aiken osteotomy    . Hammer toe surgery    . Neurectomy foot    . Appendectomy    . Tubal ligation      No family history on  file.  History   Social History  . Marital Status: Divorced    Spouse Name: N/A    Number of Children: N/A  . Years of Education: N/A   Occupational History  . Not on file.   Social History Main Topics  . Smoking status: Never Smoker   . Smokeless tobacco: Never Used  . Alcohol Use: Yes     Comment: occ  . Drug Use: No  . Sexual Activity: Not on file   Other Topics Concern  . Not on file   Social History Narrative  . No narrative on file    ROS:  Positive for dizziness, tinnitus, hoarseness,   vomiting, recent diarrhea, reflux. Otherwise as stated in the HPI and negative for all other systems.  PHYSICAL EXAM BP 102/60  Pulse 60  Ht 5\' 6"  (1.676 m)  Wt 127 lb (57.607 kg)  BMI 20.51 kg/m2 GENERAL:  Well appearing HEENT:  Pupils equal round and reactive, fundi not visualized, oral mucosa unremarkable NECK:  No jugular venous distention, waveform within normal limits, carotid upstroke brisk and symmetric, no bruits, no thyromegaly LYMPHATICS:  No cervical, inguinal adenopathy LUNGS:  Clear to auscultation bilaterally BACK:  No CVA tenderness CHEST:  Unremarkable HEART:  PMI not displaced or sustained,S1 and S2 within normal limits, no S3, no S4, no clicks, no rubs, no murmurs ABD:  Flat, positive bowel sounds normal in frequency in pitch, no bruits, no rebound, no guarding, no midline pulsatile mass, no hepatomegaly, no splenomegaly EXT:  2 plus pulses throughout, no edema, no cyanosis no clubbing SKIN:  No rashes no nodules NEURO:  Cranial nerves II through XII grossly intact, motor grossly intact throughout PSYCH:  Cognitively intact, oriented to person place and time   EKG:   Sinus bradycardia, rate 49, no acute ST-T wave changes.  07/10/2014   ASSESSMENT AND PLAN  QUESTIONABLE CARDIOMEGALY:    I will order an echocardiogram.  I suspect however that she has athletes heart.  Further treatment will be based on the findings of this study.    PALPITATIONS:  I  suspect PACs or PVCs with some exacerbation related to insomnia.  She will discuss this further with  Duane Lope, MD  CHEST PAIN:  This is atypical.  I will order a calcium score.  She will consider a POET (Plain Old Exercise Treadmill) when her foot has healed.

## 2014-07-10 NOTE — Patient Instructions (Addendum)
Your physician recommends that you schedule a follow-up appointment in:  as needed  We are ordering a CT to check on your calcium in and around your Heart

## 2014-07-14 ENCOUNTER — Ambulatory Visit (HOSPITAL_COMMUNITY)
Admission: RE | Admit: 2014-07-14 | Discharge: 2014-07-14 | Disposition: A | Discharge status: Home / Self Care | Payer: BC Managed Care – PPO | Source: Ambulatory Visit | Attending: Cardiovascular Disease | Admitting: Cardiovascular Disease

## 2014-07-14 ENCOUNTER — Ambulatory Visit (INDEPENDENT_AMBULATORY_CARE_PROVIDER_SITE_OTHER)
Admission: RE | Admit: 2014-07-14 | Discharge: 2014-07-14 | Disposition: A | Discharge status: Home / Self Care | Payer: Self-pay | Source: Ambulatory Visit | Attending: Cardiology | Admitting: Cardiology

## 2014-07-14 ENCOUNTER — Encounter: Payer: Self-pay | Admitting: Cardiology

## 2014-07-14 DIAGNOSIS — R079 Chest pain, unspecified: Secondary | ICD-10-CM | POA: Insufficient documentation

## 2014-07-14 DIAGNOSIS — I517 Cardiomegaly: Secondary | ICD-10-CM | POA: Insufficient documentation

## 2014-07-14 NOTE — Progress Notes (Signed)
2D Echocardiogram Complete.  07/14/2014   Deosha Werden, RDCS  

## 2014-07-16 ENCOUNTER — Telehealth: Payer: Self-pay | Admitting: Cardiology

## 2014-07-16 NOTE — Telephone Encounter (Signed)
New Message  Pt called to receive ECHO and calcium scoring results please call.

## 2014-07-16 NOTE — Addendum Note (Signed)
Addended by: Vincenza HewsWILDMAN, Mahamud Metts C. on: 07/16/2014 08:08 AM   Modules accepted: Orders

## 2014-07-21 ENCOUNTER — Encounter (HOSPITAL_COMMUNITY): Payer: Self-pay | Admitting: Pharmacy Technician

## 2014-07-21 ENCOUNTER — Other Ambulatory Visit: Payer: Self-pay | Admitting: *Deleted

## 2014-07-21 ENCOUNTER — Telehealth: Payer: Self-pay | Admitting: Cardiology

## 2014-07-21 DIAGNOSIS — Z01810 Encounter for preprocedural cardiovascular examination: Secondary | ICD-10-CM

## 2014-07-21 DIAGNOSIS — E782 Mixed hyperlipidemia: Secondary | ICD-10-CM

## 2014-07-21 DIAGNOSIS — D689 Coagulation defect, unspecified: Secondary | ICD-10-CM

## 2014-07-21 DIAGNOSIS — R5381 Other malaise: Secondary | ICD-10-CM

## 2014-07-21 DIAGNOSIS — R5383 Other fatigue: Secondary | ICD-10-CM

## 2014-07-21 LAB — CBC
HCT: 39.2 % (ref 36.0–46.0)
Hemoglobin: 13.5 g/dL (ref 12.0–15.0)
MCH: 32.3 pg (ref 26.0–34.0)
MCHC: 34.4 g/dL (ref 30.0–36.0)
MCV: 93.8 fL (ref 78.0–100.0)
Platelets: 264 10*3/uL (ref 150–400)
RBC: 4.18 MIL/uL (ref 3.87–5.11)
RDW: 13.6 % (ref 11.5–15.5)
WBC: 5.1 10*3/uL (ref 4.0–10.5)

## 2014-07-21 LAB — COMPREHENSIVE METABOLIC PANEL
ALT: 18 U/L (ref 0–35)
AST: 23 U/L (ref 0–37)
Albumin: 4.7 g/dL (ref 3.5–5.2)
Alkaline Phosphatase: 54 U/L (ref 39–117)
BUN: 15 mg/dL (ref 6–23)
CO2: 27 mEq/L (ref 19–32)
Calcium: 9.3 mg/dL (ref 8.4–10.5)
Chloride: 99 mEq/L (ref 96–112)
Creat: 0.64 mg/dL (ref 0.50–1.10)
Glucose, Bld: 78 mg/dL (ref 70–99)
Potassium: 4 mEq/L (ref 3.5–5.3)
Sodium: 137 mEq/L (ref 135–145)
Total Bilirubin: 0.6 mg/dL (ref 0.2–1.2)
Total Protein: 6.8 g/dL (ref 6.0–8.3)

## 2014-07-21 LAB — LIPID PANEL
Cholesterol: 223 mg/dL — ABNORMAL HIGH (ref 0–200)
HDL: 71 mg/dL (ref >39)
LDL Cholesterol: 131 mg/dL — ABNORMAL HIGH (ref 0–99)
Total CHOL/HDL Ratio: 3.1 Ratio
Triglycerides: 104 mg/dL (ref <150)
VLDL: 21 mg/dL (ref 0–40)

## 2014-07-21 LAB — PROTIME-INR
INR: 1.08 (ref <1.50)
Prothrombin Time: 14 seconds (ref 11.6–15.2)

## 2014-07-21 LAB — APTT: aPTT: 32 seconds (ref 24–37)

## 2014-07-21 NOTE — Telephone Encounter (Signed)
Spoke with patient regarding cardiac cath that was ordered by Dr. Antoine PocheHochrein.  The procedure is scheduled for Tuesday 07/22/14 with Dr. Nicki Guadalajarahomas Kelly.  [facility]:00 [facility] ----NPO after midnight--take an overnight bag.   Blood work will be done upon arrival.  Patient voiced understanding.

## 2014-07-22 ENCOUNTER — Ambulatory Visit (HOSPITAL_COMMUNITY): Payer: BC Managed Care – PPO

## 2014-07-22 ENCOUNTER — Ambulatory Visit (HOSPITAL_COMMUNITY)
Admission: RE | Admit: 2014-07-22 | Discharge: 2014-07-22 | Disposition: A | Discharge status: Home / Self Care | Payer: BC Managed Care – PPO | Source: Ambulatory Visit | Attending: Cardiovascular Disease | Admitting: Cardiovascular Disease

## 2014-07-22 ENCOUNTER — Encounter (HOSPITAL_COMMUNITY): Payer: Self-pay | Admitting: *Deleted

## 2014-07-22 ENCOUNTER — Encounter (HOSPITAL_COMMUNITY)
Admission: RE | Disposition: A | Discharge status: Home / Self Care | Payer: Self-pay | Source: Ambulatory Visit | Attending: Cardiovascular Disease

## 2014-07-22 DIAGNOSIS — R002 Palpitations: Secondary | ICD-10-CM | POA: Insufficient documentation

## 2014-07-22 DIAGNOSIS — I517 Cardiomegaly: Secondary | ICD-10-CM

## 2014-07-22 DIAGNOSIS — Z887 Allergy status to serum and vaccine status: Secondary | ICD-10-CM | POA: Diagnosis not present

## 2014-07-22 DIAGNOSIS — Z882 Allergy status to sulfonamides status: Secondary | ICD-10-CM | POA: Diagnosis not present

## 2014-07-22 DIAGNOSIS — R072 Precordial pain: Secondary | ICD-10-CM

## 2014-07-22 DIAGNOSIS — I498 Other specified cardiac arrhythmias: Secondary | ICD-10-CM | POA: Insufficient documentation

## 2014-07-22 DIAGNOSIS — R079 Chest pain, unspecified: Secondary | ICD-10-CM | POA: Diagnosis not present

## 2014-07-22 DIAGNOSIS — Z01810 Encounter for preprocedural cardiovascular examination: Secondary | ICD-10-CM

## 2014-07-22 DIAGNOSIS — Z9089 Acquired absence of other organs: Secondary | ICD-10-CM | POA: Diagnosis not present

## 2014-07-22 HISTORY — PX: LEFT HEART CATHETERIZATION WITH CORONARY ANGIOGRAM: SHX5451

## 2014-07-22 HISTORY — PX: CORONARY ANGIOGRAM: SHX5786

## 2014-07-22 LAB — URINALYSIS, MICROSCOPIC ONLY
Bacteria, UA: NONE SEEN
Casts: NONE SEEN
Crystals: NONE SEEN
Squamous Epithelial / LPF: NONE SEEN

## 2014-07-22 SURGERY — LEFT HEART CATHETERIZATION WITH CORONARY ANGIOGRAM
Anesthesia: LOCAL

## 2014-07-22 MED ORDER — HEPARIN (PORCINE) IN NACL 2-0.9 UNIT/ML-% IJ SOLN
INTRAMUSCULAR | Status: AC
  Filled 2014-07-22: qty 1500

## 2014-07-22 MED ORDER — MIDAZOLAM HCL 2 MG/2ML IJ SOLN
INTRAMUSCULAR | Status: AC
  Filled 2014-07-22: qty 2

## 2014-07-22 MED ORDER — SODIUM CHLORIDE 0.9 % IJ SOLN: 3.0000 mL | INTRAMUSCULAR | Status: DC | PRN

## 2014-07-22 MED ORDER — ACETAMINOPHEN 325 MG PO TABS: 650.0000 mg | ORAL_TABLET | ORAL | Status: DC | PRN

## 2014-07-22 MED ORDER — ONDANSETRON HCL 4 MG/2ML IJ SOLN: 4.0000 mg | Freq: Four times a day (QID) | INTRAMUSCULAR | Status: DC | PRN

## 2014-07-22 MED ORDER — ASPIRIN 81 MG PO CHEW
81.0000 mg | CHEWABLE_TABLET | ORAL | Status: AC
  Administered 2014-07-22: 81 mg via ORAL

## 2014-07-22 MED ORDER — ASPIRIN 81 MG PO CHEW
CHEWABLE_TABLET | ORAL | Status: AC
  Filled 2014-07-22: qty 1

## 2014-07-22 MED ORDER — SODIUM CHLORIDE 0.9 % IV SOLN
INTRAVENOUS | Status: DC
  Administered 2014-07-22: 10 mL/h via INTRAVENOUS

## 2014-07-22 MED ORDER — ASPIRIN EC 81 MG PO TBEC
81.0000 mg | DELAYED_RELEASE_TABLET | Freq: Every day | ORAL | Status: DC
  Filled 2014-07-22: qty 1

## 2014-07-22 MED ORDER — FENTANYL CITRATE 0.05 MG/ML IJ SOLN
INTRAMUSCULAR | Status: AC
  Filled 2014-07-22: qty 2

## 2014-07-22 MED ORDER — SODIUM CHLORIDE 0.9 % IV SOLN: INTRAVENOUS | Status: DC

## 2014-07-22 MED ORDER — NITROGLYCERIN 1 MG/10 ML FOR IR/CATH LAB
INTRA_ARTERIAL | Status: AC
  Filled 2014-07-22: qty 10

## 2014-07-22 MED ORDER — LIDOCAINE HCL (PF) 1 % IJ SOLN
INTRAMUSCULAR | Status: AC
  Filled 2014-07-22: qty 30

## 2014-07-22 NOTE — Discharge Instructions (Signed)

## 2014-07-22 NOTE — H&P (View-Only) (Signed)
HPI The patient presents for evaluation of chest pain and cardiomegaly.  She has no past cardiac history other than a negative POET (Plain Old Exercise Treadmill) many years ago.  She has been a runner for almost 30 years.  She was noted recently on a CT of her abdomen to have cardiomegaly.  I reviewed these films and there is cardiomegaly.  She does have some sensation of chest heaviness.  This is not associated with activity.  It happens at rest.  It is mild and seems to come and go spontaneously.  She does get palpitations but these are at night.  She has insomnia and feels her heart beating fast if she wakes up in the middle of the night.  She has had no syncope or presyncope.  She does have some mild DOE.  However, she has had this only, she thinks, because she has been less active because she had surgery on her foot.  She has been doing some walking but has not been able to do any running.  She does not have swelling, weight pain, PND or orthopnea.    Allergies  Allergen Reactions  . Sulfa Antibiotics     Childhood allergy  . Tetanus Toxoids Rash    Pain and swelling    Current Outpatient Prescriptions  Medication Sig Dispense Refill  . b complex vitamins tablet Take 1 tablet by mouth daily.      . Cholecalciferol 4000 UNITS CAPS Take 4,000 Units by mouth daily.      . fluticasone (FLONASE) 50 MCG/ACT nasal spray Place 1 spray into both nostrils daily.      . Multiple Vitamins-Minerals (MULTIVITAMIN PO) Take 1 tablet by mouth daily.      . Omega-3 Fatty Acids (FISH OIL) 1000 MG CAPS Take 1,000 mg by mouth daily.       No current facility-administered medications for this visit.    Past Medical History  Diagnosis Date  . Thyroid disease     Past Surgical History  Procedure Laterality Date  . Metatarsal osteotomy with bunionectomy    . Aiken osteotomy    . Hammer toe surgery    . Neurectomy foot    . Appendectomy    . Tubal ligation      No family history on  file.  History   Social History  . Marital Status: Divorced    Spouse Name: N/A    Number of Children: N/A  . Years of Education: N/A   Occupational History  . Not on file.   Social History Main Topics  . Smoking status: Never Smoker   . Smokeless tobacco: Never Used  . Alcohol Use: Yes     Comment: occ  . Drug Use: No  . Sexual Activity: Not on file   Other Topics Concern  . Not on file   Social History Narrative  . No narrative on file    ROS:  Positive for dizziness, tinnitus, hoarseness,   vomiting, recent diarrhea, reflux. Otherwise as stated in the HPI and negative for all other systems.  PHYSICAL EXAM BP 102/60  Pulse 60  Ht 5\' 6"  (1.676 m)  Wt 127 lb (57.607 kg)  BMI 20.51 kg/m2 GENERAL:  Well appearing HEENT:  Pupils equal round and reactive, fundi not visualized, oral mucosa unremarkable NECK:  No jugular venous distention, waveform within normal limits, carotid upstroke brisk and symmetric, no bruits, no thyromegaly LYMPHATICS:  No cervical, inguinal adenopathy LUNGS:  Clear to auscultation bilaterally BACK:  No CVA tenderness CHEST:  Unremarkable HEART:  PMI not displaced or sustained,S1 and S2 within normal limits, no S3, no S4, no clicks, no rubs, no murmurs ABD:  Flat, positive bowel sounds normal in frequency in pitch, no bruits, no rebound, no guarding, no midline pulsatile mass, no hepatomegaly, no splenomegaly EXT:  2 plus pulses throughout, no edema, no cyanosis no clubbing SKIN:  No rashes no nodules NEURO:  Cranial nerves II through XII grossly intact, motor grossly intact throughout PSYCH:  Cognitively intact, oriented to person place and time   EKG:   Sinus bradycardia, rate 49, no acute ST-T wave changes.  07/10/2014   ASSESSMENT AND PLAN  QUESTIONABLE CARDIOMEGALY:    I will order an echocardiogram.  I suspect however that she has athletes heart.  Further treatment will be based on the findings of this study.    PALPITATIONS:  I  suspect PACs or PVCs with some exacerbation related to insomnia.  She will discuss this further with  Duane Lope, MD  CHEST PAIN:  This is atypical.  I will order a calcium score.  She will consider a POET (Plain Old Exercise Treadmill) when her foot has healed.

## 2014-07-22 NOTE — Progress Notes (Addendum)
Pt c/o change in peripheral vision, "looks like a ceiling fan moving to my left" pupils equal and reactive to light, no other complants.  VSS. Dr Tresa EndoKelly notified, no new orders, will continue to monitor

## 2014-07-22 NOTE — CV Procedure (Signed)
[name] is a [age] y.o. female    [id number]  [id number] LOCATION:  FACILITY: [facility]  PHYSICIAN: Lennette Biharihomas A. Kelly, MD, [facility] 07-30-1956   DATE OF PROCEDURE:  07/22/2014    CARDIAC CATHETERIZATION     HISTORY:    Jane MawKaren A [name] is a 58 y.o. female who is referred for diagnostic catheterization by Dr. Antoine PocheHochrein  The patient has experienced episodes of intermittent chest pain, as well as palpitations.  A calcium score CT demonstrated calcium in the left main and proximal LAD with a calcium score of 100,  92 percentile for age and sex matched control because of continued symptomatology, she is now referred for definitive cardiac catheterization   PROCEDURE: Left heart catheterization: Coronary angiography, left ventriculography.  The [facility] lab[facility]atory in the postabsorptive state. She was premedicated with Versed 3 mg and fentanyl 50 mcg.  Due to her very small wrist it was felt that she would have the increased incidence of radial artery spasm and consequently a femoral approach was utilized. Her right groin was prepped and shaved in usual sterile fashion. Xylocaine 1% was used for local anesthesia. A 5 French sheath was inserted into the R femoral artery. Diagnostic catheterizatiion was done with 5 [place] LF4, FR4, and pigtail catheters. Left ventriculography was done with 24 cc Omnipaque contrast. Hemostasis was obtained by direct manual compression. The patient tolerated the procedure well.   HEMODYNAMICS:   Central Aorta: 130/67   Left Ventricle: 130/5  ANGIOGRAPHY:  Left main: Angiographically normal and bifurcated into the LAD and left circumflex vessel.   LAD: There was a small area of extraluminal calcification parallel to the ostium of the LAD. Otherwise, there was no evidence for any luminal narrowing.  The midportion of the LAD intramyocardially but there was no evidence for systolic muscle bridging.  Left circumflex:  Angiographically normal vessel which gave rise to one major marginal branch   Right coronary artery: Large dominant angiographically normal RCA   Left ventriculography revealed normal LV contractility with an ejection fraction of approximately 60%.  There were no focal, segmental wall motion abnormalities.  There is no evidence for mitral regurgitation.   IMPRESSION:  Mild focal extraluminal calcification parallel to the ostium of the LAD in otherwise normal appearing coronary arteries without obstructive disease.  Normal LV function.  Lennette Biharihomas A. Kelly, MD, [facility] 07/22/2014 12:46 PM

## 2014-07-22 NOTE — Interval H&P Note (Signed)
Cath Lab Visit (complete for each Cath Lab visit)  Clinical Evaluation Leading to the Procedure:   ACS: No.  Non-ACS:    Anginal Classification: CCS II  Anti-ischemic medical therapy: No Therapy  Non-Invasive Test Results: No non-invasive testing performed  Prior CABG: No previous CABG      History and Physical Interval Note:  07/22/2014 12:16 PM  Meade MawKaren A Mo  has presented today for surgery, with the diagnosis of cp  The various methods of treatment have been discussed with the patient and family. After consideration of risks, benefits and other options for treatment, the patient has consented to  Procedure(s): LEFT HEART CATHETERIZATION WITH CORONARY ANGIOGRAM (N/A) as a surgical intervention .  The patient's history has been reviewed, patient examined, no change in status, stable for surgery.  I have reviewed the patient's chart and labs.  Questions were answered to the patient's satisfaction.     KELLY,THOMAS A

## 2014-07-22 NOTE — Progress Notes (Signed)
Site area: right groin 5 fr femoral sheath Site Prior to Removal:  Level 0 Pressure Applied For:20 minutes Manual:   yes Patient Status During Pull:  No complications  Post Pull Site:  Level 0 Post Pull Instructions Given:  yes Post Pull Pulses Present: dp 2+ Dressing Applied:  tegaderm Bedrest begins @ 1335 Comments: sipping fluids, snacking

## 2014-07-24 ENCOUNTER — Encounter: Payer: Self-pay | Admitting: Cardiovascular Disease

## 2014-08-06 ENCOUNTER — Ambulatory Visit (INDEPENDENT_AMBULATORY_CARE_PROVIDER_SITE_OTHER): Payer: BC Managed Care – PPO | Admitting: Podiatry

## 2014-08-06 ENCOUNTER — Encounter: Payer: Self-pay | Admitting: Podiatry

## 2014-08-06 ENCOUNTER — Ambulatory Visit (INDEPENDENT_AMBULATORY_CARE_PROVIDER_SITE_OTHER): Payer: BC Managed Care – PPO

## 2014-08-06 VITALS — BP 109/68 | HR 55 | Resp 16

## 2014-08-06 DIAGNOSIS — M2041 Other hammer toe(s) (acquired), right foot: Secondary | ICD-10-CM

## 2014-08-06 DIAGNOSIS — M2040 Other hammer toe(s) (acquired), unspecified foot: Secondary | ICD-10-CM

## 2014-08-06 DIAGNOSIS — M2010 Hallux valgus (acquired), unspecified foot: Secondary | ICD-10-CM

## 2014-08-06 NOTE — Progress Notes (Signed)
Subjective:     Patient ID: [name], female   DOB: 1956/02/13, 58 y.o.   MRN: [id number]  HPI patient states that my right foot is doing much better but I still gets some little nerve E. pains but I'm not having discomfort I had prior to surgery   Review of Systems     Objective:   Physical Exam Neurovascular status is intact with digits which are well aligned first metatarsal but well aligned and well-healing surgical sites with minimal edema and no discomfort with palpation    Assessment:     Patient's doing very well postoperatively with mild discomfort still noted secondary to extensive surgery    Plan:     Reviewed final x-rays and allow patient to return to activity. Patient will be seen back for final visit in 4 months earlier if any issues should occur

## 2014-08-12 ENCOUNTER — Ambulatory Visit (INDEPENDENT_AMBULATORY_CARE_PROVIDER_SITE_OTHER): Payer: BC Managed Care – PPO | Admitting: Cardiology

## 2014-08-12 ENCOUNTER — Telehealth: Payer: Self-pay | Admitting: Cardiology

## 2014-08-12 ENCOUNTER — Encounter: Payer: Self-pay | Admitting: Cardiology

## 2014-08-12 VITALS — BP 125/85 | HR 53 | Ht 66.0 in | Wt 126.2 lb

## 2014-08-12 DIAGNOSIS — R072 Precordial pain: Secondary | ICD-10-CM

## 2014-08-12 DIAGNOSIS — I517 Cardiomegaly: Secondary | ICD-10-CM

## 2014-08-12 DIAGNOSIS — R931 Abnormal findings on diagnostic imaging of heart and coronary circulation: Secondary | ICD-10-CM | POA: Insufficient documentation

## 2014-08-12 DIAGNOSIS — R9439 Abnormal result of other cardiovascular function study: Secondary | ICD-10-CM

## 2014-08-12 DIAGNOSIS — I77810 Thoracic aortic ectasia: Secondary | ICD-10-CM

## 2014-08-12 HISTORY — DX: Abnormal findings on diagnostic imaging of heart and coronary circulation: R93.1

## 2014-08-12 NOTE — Assessment & Plan Note (Signed)
Not noted on echo or cath

## 2014-08-12 NOTE — Progress Notes (Signed)
    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 This showed an incidental finding of CM. Coronary CTA showed a high Ca++ score as well as a mildly dilated aortic root- 4.2cm. She had an echo which was normal (normal Ao root) and a coronary angiogram that showed normal coronaries. She is here for follow up.   Current Outpatient Prescriptions  Medication Sig Dispense Refill  . b complex vitamins tablet Take 1 tablet by mouth daily.      . Cholecalciferol 4000 UNITS CAPS Take 4,000 Units by mouth daily.      . fluticasone (FLONASE) 50 MCG/ACT nasal spray Place 1 spray into both nostrils daily as needed for allergies.       . Multiple Vitamins-Minerals (MULTIVITAMIN PO) Take 1 tablet by mouth daily.      . Omega-3 Fatty Acids (FISH OIL) 1000 MG CAPS Take 1,000 mg by mouth daily.       No current facility-administered medications for this visit.    Allergies  Allergen Reactions  . Sulfa Antibiotics     Childhood allergy  . Tetanus Toxoids Rash    Pain and swelling at injection site    History   Social History  . Marital Status: Married    Spouse Name: N/A    Number of Children: 2  . Years of Education: N/A   Occupational History  . Not on file.   Social History Main Topics  . Smoking status: Never Smoker   . Smokeless tobacco: Never Used  . Alcohol Use: Yes     Comment: occ  . Drug Use: No  . Sexual Activity: Not on file   Other Topics Concern  . Not on file   Social History Narrative   Lives with husband.       Review of Systems: General: negative for chills, fever, night sweats or weight changes.  Cardiovascular: negative for chest pain, dyspnea on exertion, edema, orthopnea, palpitations, paroxysmal nocturnal dyspnea or shortness of breath Dermatological: negative for rash Respiratory: negative for cough or  wheezing Urologic: negative for hematuria Abdominal: negative for nausea, vomiting, diarrhea, bright red blood per rectum, melena, or hematemesis Neurologic: negative for visual changes, syncope, or dizziness All other systems reviewed and are otherwise negative except as noted above.    Blood pressure 125/85, pulse 53, height  (1.676 m), weight 126 lb 3.2 oz (57.244 kg).  General appearance: alert, cooperative and no distress Extremities: Rt groin without hematoma  EKG NSR  ASSESSMENT AND PLAN:   Cardiomegaly Not noted on echo or cath  Precordial chest pain Normal coronaries 07/22/14   PLAN  Initially I suggested she see [place] on a prn basis, he son is also followed by Dr Antoine Poche. After reviewing her A results with Dr Allyson Sabal he suggested a follow up CTA in one year. I have called the pt and left a message explaining this.   Debborah Alonge KPA-C 08/12/2014 8:53 AM

## 2014-08-12 NOTE — Assessment & Plan Note (Signed)
Normal coronaries 07/22/14

## 2014-08-12 NOTE — Patient Instructions (Signed)
Corine Shelter, PA-C has recommended you follow-up on an as needed basis.

## 2014-08-12 NOTE — Telephone Encounter (Signed)
Mild Aortic root dilatation on CTA 4.2cm. Not noted on echo or cath. Reviewed with Dr Allyson Sabal- he suggest a f/u CTA in one year.   Corine Shelter PA-C 08/12/2014 9:03 AM

## 2014-10-29 ENCOUNTER — Encounter: Payer: Self-pay | Admitting: Podiatry

## 2014-10-29 ENCOUNTER — Ambulatory Visit (INDEPENDENT_AMBULATORY_CARE_PROVIDER_SITE_OTHER): Payer: BC Managed Care – PPO | Admitting: Podiatry

## 2014-10-29 ENCOUNTER — Ambulatory Visit (INDEPENDENT_AMBULATORY_CARE_PROVIDER_SITE_OTHER): Payer: BC Managed Care – PPO

## 2014-10-29 VITALS — BP 143/82 | HR 54 | Resp 16

## 2014-10-29 DIAGNOSIS — M2041 Other hammer toe(s) (acquired), right foot: Secondary | ICD-10-CM

## 2014-10-29 DIAGNOSIS — M779 Enthesopathy, unspecified: Secondary | ICD-10-CM

## 2014-10-29 NOTE — Progress Notes (Signed)
Subjective:     Patient ID: [name], female   DOB: 07-17-1956, 58 y.o.   MRN: [id number]  HPIpatient states that I'm doing pretty well but I still am having some pain in my feet and both of them feel like they need support   Review of Systems     Objective:   Physical Exam Approximate 4 months after having multiple foot surgery right which is done very well with wound edges well coapted good alignment noted and range of motion first MPJ which continues to improve and is approximate 30 dorsi 20 plantar flexion. Does have moderate discomfort in the arch of both feet    Assessment:     Tendinitis bilateral with well-healing surgical foot right    Plan:     Reviewed tendinitis and discussed correction of her right foot which looks excellent on x-rays. Advised on orthotics long-term to try to control pathological motion and rebuild her arch height and she is scanned today for custom orthotics will be seen back when returned

## 2014-11-20 ENCOUNTER — Encounter (HOSPITAL_COMMUNITY): Payer: Self-pay | Admitting: Cardiovascular Disease

## 2014-11-24 ENCOUNTER — Encounter: Payer: BC Managed Care – PPO | Admitting: Podiatry

## 2014-11-24 ENCOUNTER — Ambulatory Visit: Payer: BC Managed Care – PPO

## 2014-11-24 DIAGNOSIS — M79671 Pain in right foot: Secondary | ICD-10-CM

## 2014-11-24 NOTE — Patient Instructions (Signed)

## 2014-12-08 ENCOUNTER — Ambulatory Visit (INDEPENDENT_AMBULATORY_CARE_PROVIDER_SITE_OTHER): Payer: BC Managed Care – PPO | Admitting: Podiatry

## 2014-12-08 ENCOUNTER — Ambulatory Visit (INDEPENDENT_AMBULATORY_CARE_PROVIDER_SITE_OTHER): Payer: BC Managed Care – PPO

## 2014-12-08 ENCOUNTER — Encounter: Payer: Self-pay | Admitting: Podiatry

## 2014-12-08 VITALS — BP 143/82 | HR 64 | Resp 16

## 2014-12-08 DIAGNOSIS — M2041 Other hammer toe(s) (acquired), right foot: Secondary | ICD-10-CM

## 2014-12-08 DIAGNOSIS — M79671 Pain in right foot: Secondary | ICD-10-CM

## 2014-12-08 NOTE — Progress Notes (Signed)
Subjective:     Patient ID: [name], female   DOB: [birth year], 58 y.o.   MRN: [id number]  HPI patient presents stating that she traumatized her right Thanksgiving and it's been quite sore and making it hard for her to walk on. States she is doing very well prior to that with her surgery   Review of Systems     Objective:   Physical Exam Neurovascular status intact with trauma to the right first MPJ with inflammation and fluid around the joint and pain when the joint is moved    Assessment:     Inflammatory capsulitis right first MPJ secondary to trauma that occurred 4 weeks ago    Plan:     Reviewed condition and discussed anti-inflammatories and the wearing of supportive shoe gear. Dispensed orthotics with instructions and reviewed x-rays indicating no fracture

## 2014-12-08 NOTE — Patient Instructions (Signed)

## 2014-12-11 NOTE — Progress Notes (Signed)
This encounter was created in error - please disregard.  This encounter was created in error - please disregard.

## 2014-12-11 NOTE — Progress Notes (Signed)
Subjective:    Patient ID: [name], female   DOB: 1956/09/13, 58 y.o.   MRN: [id number]  HPI   Review of Systems   Objective:  Physical Exam   Assessment:      Plan:

## 2015-01-19 ENCOUNTER — Ambulatory Visit: Payer: BC Managed Care – PPO | Admitting: Podiatry

## 2015-01-27 ENCOUNTER — Other Ambulatory Visit: Payer: Self-pay | Admitting: Dermatology

## 2015-03-16 ENCOUNTER — Ambulatory Visit (INDEPENDENT_AMBULATORY_CARE_PROVIDER_SITE_OTHER): Payer: BLUE CROSS/BLUE SHIELD

## 2015-03-16 ENCOUNTER — Encounter: Payer: Self-pay | Admitting: Podiatry

## 2015-03-16 ENCOUNTER — Ambulatory Visit (INDEPENDENT_AMBULATORY_CARE_PROVIDER_SITE_OTHER): Payer: BLUE CROSS/BLUE SHIELD | Admitting: Podiatry

## 2015-03-16 VITALS — BP 104/55 | HR 49 | Resp 18

## 2015-03-16 DIAGNOSIS — R52 Pain, unspecified: Secondary | ICD-10-CM

## 2015-03-16 DIAGNOSIS — M779 Enthesopathy, unspecified: Secondary | ICD-10-CM | POA: Diagnosis not present

## 2015-03-16 MED ORDER — TRIAMCINOLONE ACETONIDE 10 MG/ML IJ SUSP
10.0000 mg | Freq: Once | INTRAMUSCULAR | Status: AC
  Administered 2015-03-16: 10 mg

## 2015-03-16 NOTE — Progress Notes (Signed)
Subjective:     Patient ID: [name], female   DOB: Dec 20, 1955, 59 y.o.   MRN: [id number]  HPI patient presents stating I'm doing pretty well with my surgery but I'm getting a lot of pain in the lateral side of my foot with keratotic lesion underneath the fifth metatarsal and inflammation and pain in the ankle joint because of probably walking differently   Review of Systems     Objective:   Physical Exam Neurovascular status intact muscle strength adequate with inflammation and pain in the sinus tarsi right with fluid buildup noted    Assessment:     Sinus tarsitis right with inflammation and fluid around the joint surface    Plan:     H&P reviewed and today I explained probable compensation in gait and I injected the sinus tarsi right 3 mg Kenalog 5 mg Xylocaine and debrided the lesion on the side. Reappoint to recheck

## 2015-04-13 ENCOUNTER — Encounter: Payer: Self-pay | Admitting: Podiatry

## 2015-04-13 ENCOUNTER — Ambulatory Visit (INDEPENDENT_AMBULATORY_CARE_PROVIDER_SITE_OTHER): Payer: BLUE CROSS/BLUE SHIELD | Admitting: Podiatry

## 2015-04-13 VITALS — BP 102/64 | HR 43 | Resp 10

## 2015-04-13 DIAGNOSIS — M2041 Other hammer toe(s) (acquired), right foot: Secondary | ICD-10-CM | POA: Diagnosis not present

## 2015-04-13 DIAGNOSIS — Q667 Congenital pes cavus: Secondary | ICD-10-CM | POA: Diagnosis not present

## 2015-04-13 DIAGNOSIS — M216X9 Other acquired deformities of unspecified foot: Secondary | ICD-10-CM

## 2015-04-13 NOTE — Progress Notes (Signed)
Subjective:     Patient ID: [name], female   DOB: 1956/07/01, 59 y.o.   MRN: [id number]  HPI patient states I'm doing pretty well overall but still gets some pain in my big toe joint and pain underneath my fifth metatarsal along with on my fifth toe. States it's been going on for a fairly good. Time but she is definitely better than before surgery but still gets some discomfort   Review of Systems     Objective:   Physical Exam Neurovascular status intact with elevated fourth and fifth toes right foot especially fifth toe right foot with keratotic lesion and plantar flexed fifth metatarsal with lesion formation. Mild discomfort when I put the first MPJ through full range of motion    Assessment:     Hammertoe deformity with lesion formation fifth toe right and elevated fourth toe right secondary to moderate scar formation secondary to previous surgery with probable low grade arthritis the big toe joint right    Plan:     H&P and all conditions discussed with patient and explained. I've recommended ultimately tenotomy of the fourth and fifth digits right at the MPJ joint order to lower them and explained procedure to patient. She wants to have it done but not at the current time

## 2015-09-21 IMAGING — CT CT ABD-PELV W/ CM
3 of 5 series · 13 of 36 positions shown, 19 images · IV contrast (OMNI 300/WATER & [ID] OMNI 300)
Comparison: At the lung bases linear scarring or atelectasis is
noted in the left lower lobe.

CLINICAL DATA: Right upper quadrant abdominal pain, elevated liver
function tests, dark urine

EXAM:
CT ABDOMEN AND PELVIS WITH CONTRAST
TECHNIQUE: Multidetector CT imaging of the abdomen and pelvis was performed
using the standard protocol following bolus administration of
intravenous contrast.
CONTRAST:  30mL OMNIPAQUE IOHEXOL 300 MG/ML SOLN, 100mL OMNIPAQUE
IOHEXOL 300 MG/ML SOLN

[Series 3: abd/pelvis with · axial · 0.70mm/px · z∈[-344,-44]mm · 7 of 80 slices shown, 12 images]
[im 10/80  soft-tissue]
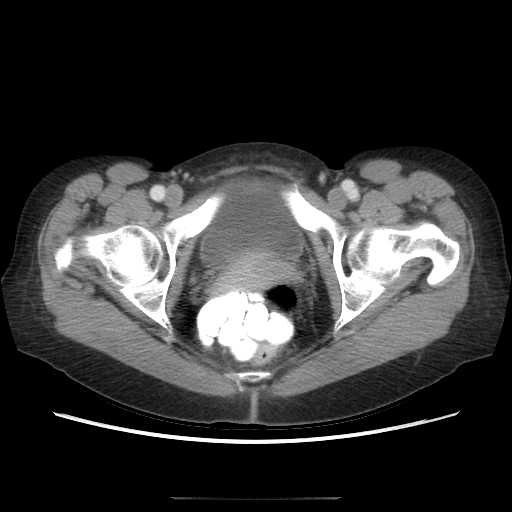
[im 10/80  bone]
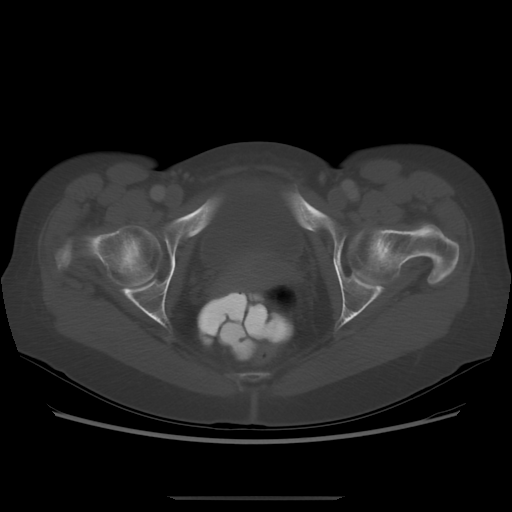
[im 20/80  soft-tissue]
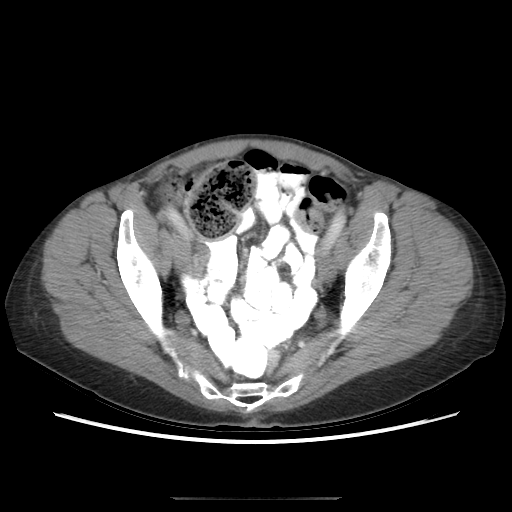
[im 30/80  soft-tissue]
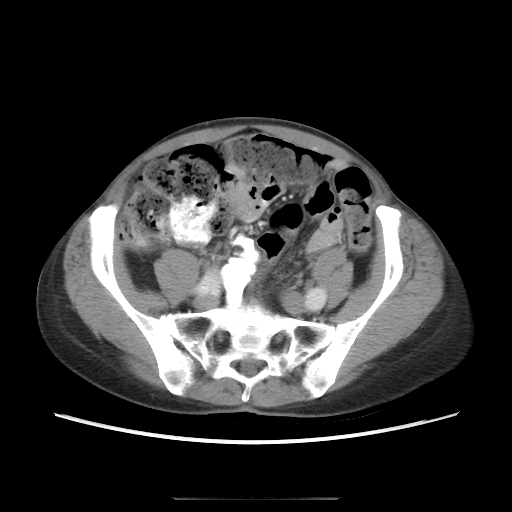
[im 40/80  soft-tissue]
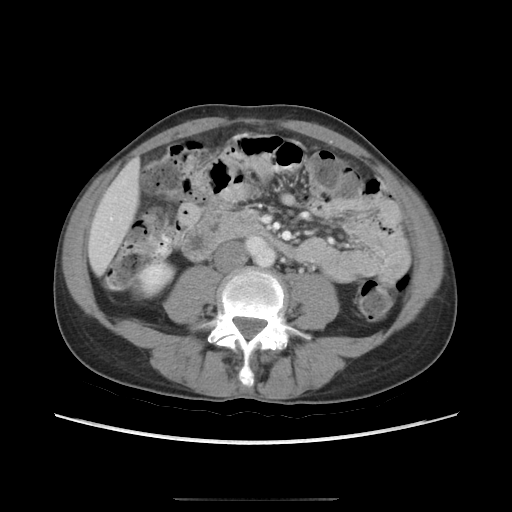
[im 40/80  lung]
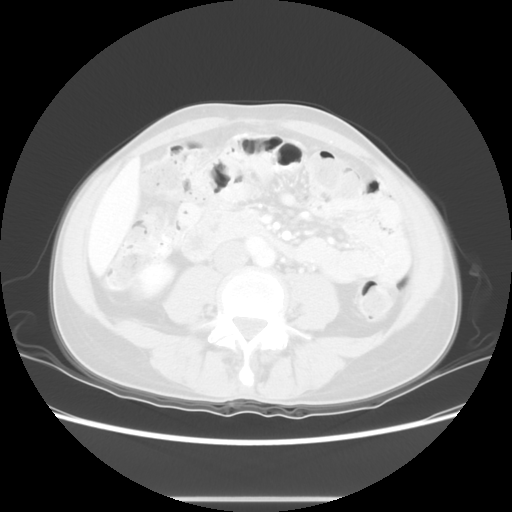
[im 50/80  soft-tissue]
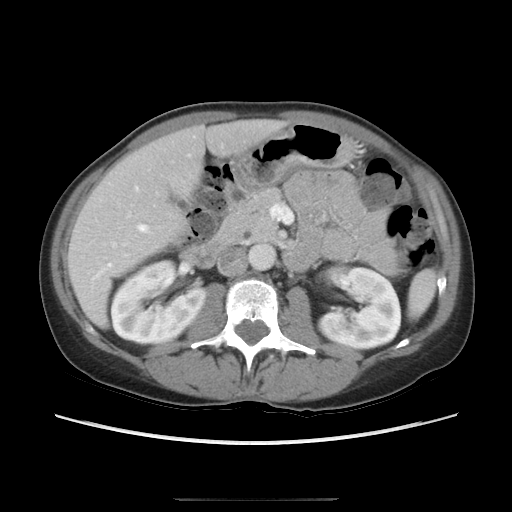
[im 50/80  lung]
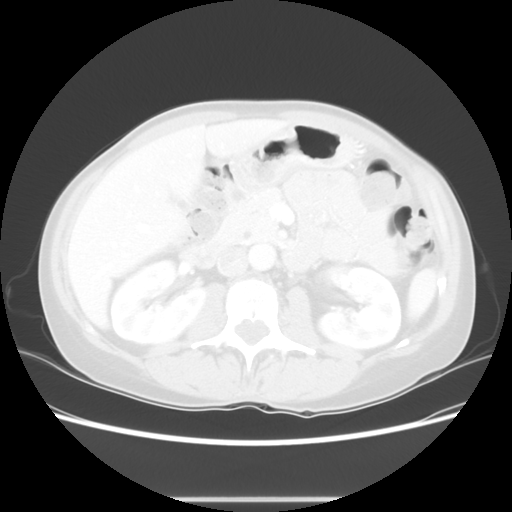
[im 60/80  soft-tissue]
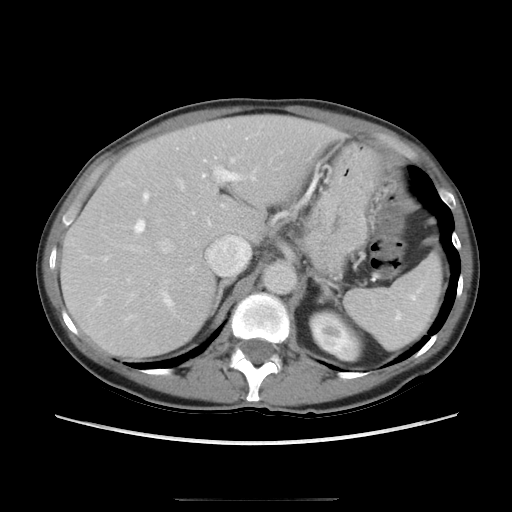
[im 60/80  lung]
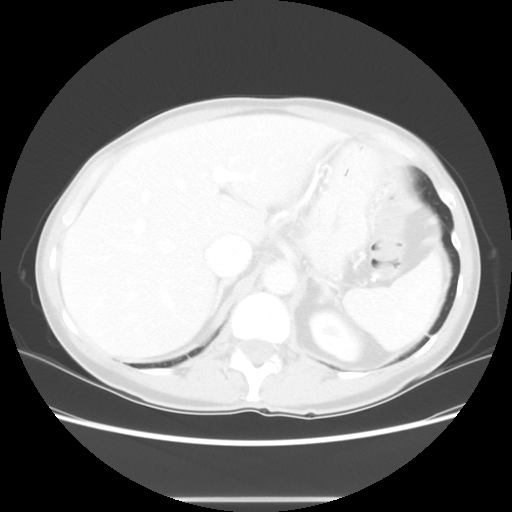
[im 70/80  soft-tissue]
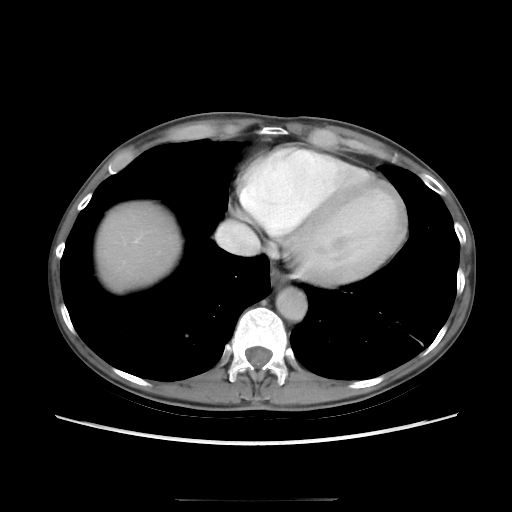
[im 70/80  lung]
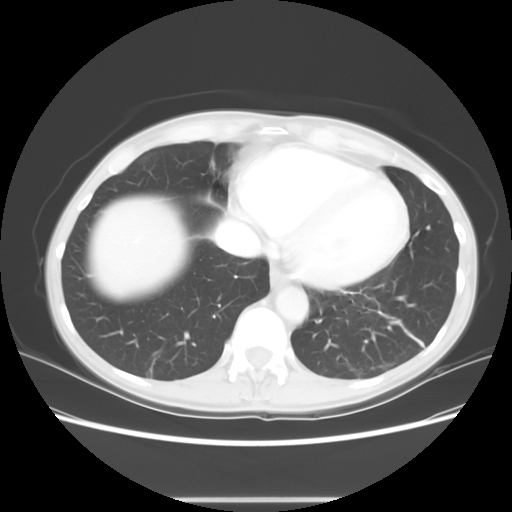

[Series 601: coronal body · coronal · 0.85mm/px · 1 of 101 slices shown, 2 images]
[im 34/101  soft-tissue]
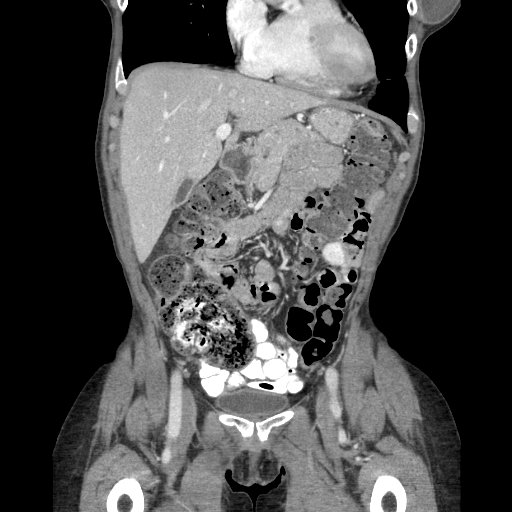
[im 34/101  bone]
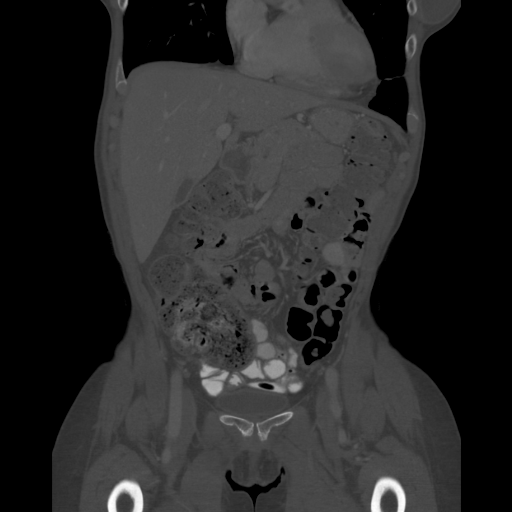

[Series 602: sagittal body · sagittal · 0.85mm/px · 5 of 145 slices shown]
[im 10/145  soft-tissue]
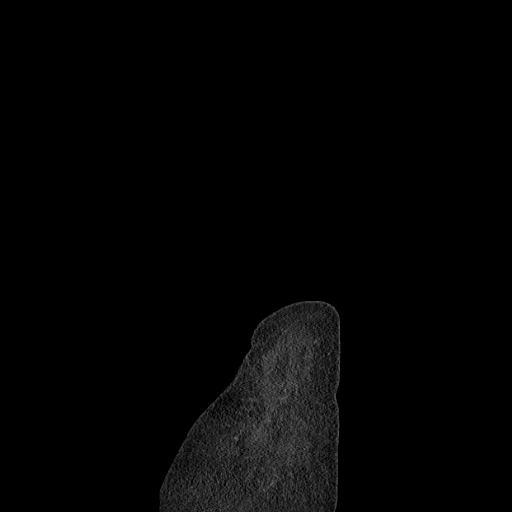
[im 28/145  soft-tissue]
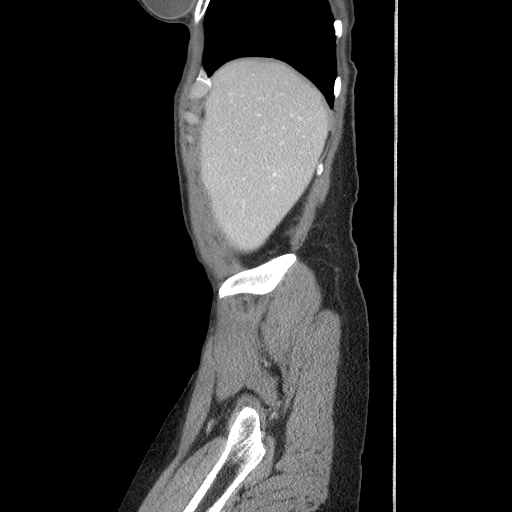
[im 46/145  soft-tissue]
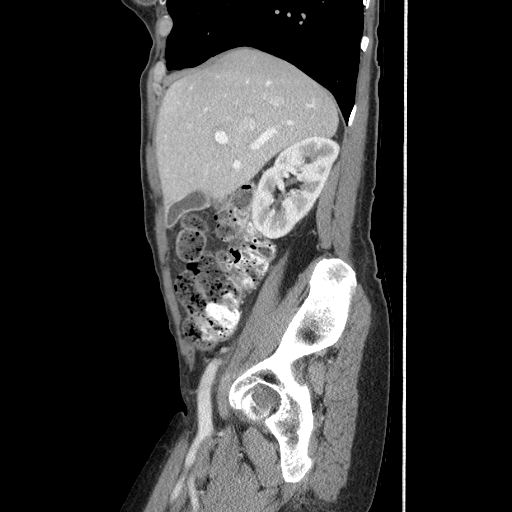
[im 64/145  soft-tissue]
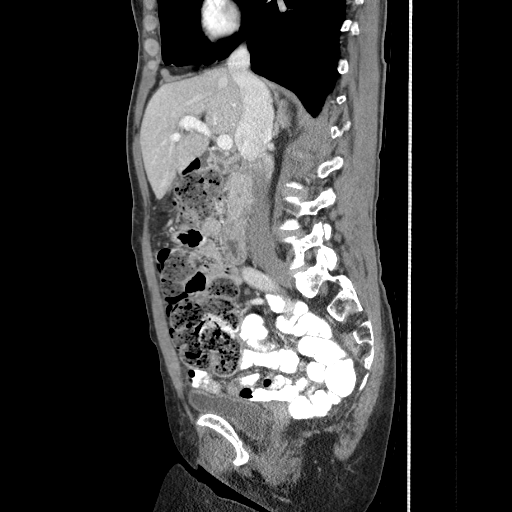
[im 82/145  soft-tissue]
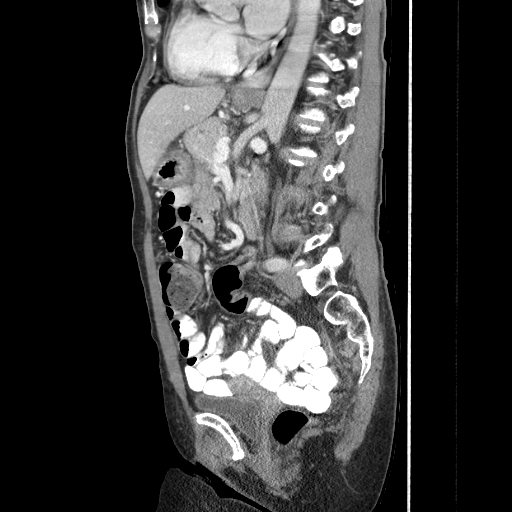

[13 of 36 positions shown; findings below may reference images not displayed]

Cardiomegaly is noted. The liver
enhances with no focal abnormality other than a sub cm cyst
anteriorly in the right lobe. No ductal dilatation is seen. No
calcified gallstones are noted. The pancreas is normal in size and
the pancreatic duct is not dilated. The adrenal glands and spleen
are unremarkable. The stomach is decompressed. The kidneys enhance
and no calculus or mass is seen. On delayed images, the
pelvocaliceal systems are unremarkable and the proximal ureters are
normal in caliber. The abdominal aorta is normal in caliber. No
adenopathy is seen.

The urinary bladder is unremarkable. The uterus is normal in size.
No adnexal lesion is seen. No fluid is noted within the pelvis. A
moderate amount of feces is noted throughout colon. The terminal
ileum is well seen and appears unremarkable. The appendix has
previously been resected. The lumbar vertebrae are in normal
alignment with mild degenerative disc disease at L2-3.
FINDINGS: 1. No explanation for the patient's right upper quadrant abdominal
pain or elevated LFTs is seen.
2. The terminal ileum appears normal. The appendix has previously
been resected.

## 2015-11-27 ENCOUNTER — Ambulatory Visit (INDEPENDENT_AMBULATORY_CARE_PROVIDER_SITE_OTHER): Payer: BLUE CROSS/BLUE SHIELD | Admitting: Podiatry

## 2015-11-27 ENCOUNTER — Ambulatory Visit (INDEPENDENT_AMBULATORY_CARE_PROVIDER_SITE_OTHER): Payer: BLUE CROSS/BLUE SHIELD

## 2015-11-27 ENCOUNTER — Encounter: Payer: Self-pay | Admitting: Podiatry

## 2015-11-27 VITALS — BP 117/83 | HR 47 | Resp 16

## 2015-11-27 DIAGNOSIS — M79671 Pain in right foot: Secondary | ICD-10-CM

## 2015-12-06 NOTE — Progress Notes (Signed)
Subjective:     Patient ID: [name], female   DOB: Aug 15, 1956, 59 y.o.   MRN: [id number]  HPI patient states I fell a lot of discomfort underneath my right foot if I walk a certain way and it almost feels like the screw could be causing it and my fifth toe right has become very tender and making it hard for me to wear shoe gear   Review of Systems     Objective:   Physical Exam  neurovascular status intact muscle strength adequate with patient having what appears to be irritation underneath the right third metatarsal where screw head and applied to the second and third metatarsals. The fifth digit right is elevated with keratotic lesion formation and mild elevation of the fourth toe was noted    Assessment:      possibility for screw irritation right with excellent alignment of the second and third toes in good excision of the underlying metatarsals. Hammertoe deformity fifth right with pain and mild elevation fourth toe right    Plan:      H&P and all conditions and x-rays reviewed with patient. At this point I recommended removal of the screws arthroplasty fifth digit right with tenotomy. I did explain there is no guarantee that this will solve her problem and it may require other surgery in the future but I am very optimistic this will make a big difference for her. Patient wants the procedures done and I allowed her to read a consent form reviewing alternative treatments complications and she is willing to accept risk signs consent form and is scheduled for outpatient surgery

## 2015-12-15 ENCOUNTER — Encounter: Payer: Self-pay | Admitting: *Deleted

## 2015-12-15 DIAGNOSIS — Z4889 Encounter for other specified surgical aftercare: Secondary | ICD-10-CM | POA: Diagnosis not present

## 2015-12-15 DIAGNOSIS — M2041 Other hammer toe(s) (acquired), right foot: Secondary | ICD-10-CM | POA: Diagnosis not present

## 2015-12-21 ENCOUNTER — Telehealth: Payer: Self-pay | Admitting: *Deleted

## 2015-12-21 MED ORDER — TRAMADOL HCL 50 MG PO TABS: 50.0000 mg | ORAL_TABLET | Freq: Three times a day (TID) | ORAL | Status: DC | PRN

## 2015-12-21 NOTE — Telephone Encounter (Signed)
Pt states she would like pain medication to get her to Thursday's appt, but not Vicodin, she has used Tramadol before and would like that called in.  Dr. Charlsie Merlesegal states call in Tramadol 50mg  #45 one tablet tid +1refill.  [facility]

## 2015-12-24 ENCOUNTER — Ambulatory Visit (INDEPENDENT_AMBULATORY_CARE_PROVIDER_SITE_OTHER): Payer: BLUE CROSS/BLUE SHIELD

## 2015-12-24 ENCOUNTER — Encounter: Payer: Self-pay | Admitting: Podiatry

## 2015-12-24 ENCOUNTER — Ambulatory Visit (INDEPENDENT_AMBULATORY_CARE_PROVIDER_SITE_OTHER): Payer: BLUE CROSS/BLUE SHIELD | Admitting: Podiatry

## 2015-12-24 VITALS — Temp 97.7°F

## 2015-12-24 DIAGNOSIS — Z9889 Other specified postprocedural states: Secondary | ICD-10-CM | POA: Diagnosis not present

## 2015-12-24 DIAGNOSIS — M2041 Other hammer toe(s) (acquired), right foot: Secondary | ICD-10-CM

## 2015-12-24 DIAGNOSIS — M79671 Pain in right foot: Secondary | ICD-10-CM

## 2015-12-27 NOTE — Progress Notes (Signed)
Subjective:     Patient ID: [name], female   DOB: [birth year], 60 y.o.   MRN: [id number]  HPI patient presents stating I'm doing well and I think taking the screw that has made a difference   Review of Systems     Objective:   Physical Exam Neurovascular status intact muscle strength was adequate with patient having good alignment and good dressings on the right foot with wound edges coapted and digits and good alignment2    Assessment:     Doing well post surgical intervention right foot    Plan:     Reviewed x-rays and reapplied sterile dressing instructed on continued immobilization and elevation and reappoint 2 weeks suture removal or earlier if needed

## 2016-01-07 NOTE — Progress Notes (Signed)
Patient had surgery at [facility].  TENOTOMY SINGLE 4TH RT FOOT,  HAMMER TOE REPAIR 5TH TOE, REMOVAL FIXATION DEEP KWIRE/SCREW  2ND/3RD RT FOOT.  RX VICODIN 10/325MG 

## 2016-01-08 ENCOUNTER — Ambulatory Visit (INDEPENDENT_AMBULATORY_CARE_PROVIDER_SITE_OTHER): Payer: BLUE CROSS/BLUE SHIELD

## 2016-01-08 VITALS — BP 92/58 | HR 74 | Resp 16

## 2016-01-08 DIAGNOSIS — M2041 Other hammer toe(s) (acquired), right foot: Secondary | ICD-10-CM

## 2016-01-08 DIAGNOSIS — G5761 Lesion of plantar nerve, right lower limb: Secondary | ICD-10-CM

## 2016-01-08 DIAGNOSIS — Z9889 Other specified postprocedural states: Secondary | ICD-10-CM

## 2016-01-08 NOTE — Progress Notes (Signed)
Pt presents stating that her foot is doing well, she is still having some minor pain on the 5th digit of her right foot. She is status post hammertoe repair 5th distal right, she had kwire removal and tenotomy 4th right foot. Sutures were removed, wound edges aligned and approx. No drainage, no s/s of infection noted. Pt currently in a regular shoe and tolerating it well. Advised to continue with using regular shoe, apply bandage to area if tender. Pt had vagal response when removing stitches. She stated that she fel tlight headed and nauseated. Laid pt down in chair, applied cold compress, vs are stable at 92/58 78 16. Pt states that her baseline BP is low normally. Pt rested and was feeling better when discharged from office. She is to follow up with Dr Charlsie Merles in 3 weeks for final x-rays and evaluation, sooner if problems arise

## 2016-01-29 ENCOUNTER — Ambulatory Visit: Payer: Self-pay

## 2016-01-29 ENCOUNTER — Ambulatory Visit (INDEPENDENT_AMBULATORY_CARE_PROVIDER_SITE_OTHER): Payer: BLUE CROSS/BLUE SHIELD | Admitting: Podiatry

## 2016-01-29 DIAGNOSIS — M779 Enthesopathy, unspecified: Secondary | ICD-10-CM

## 2016-01-29 DIAGNOSIS — L84 Corns and callosities: Secondary | ICD-10-CM

## 2016-01-29 DIAGNOSIS — Z9889 Other specified postprocedural states: Secondary | ICD-10-CM

## 2016-01-29 DIAGNOSIS — G5761 Lesion of plantar nerve, right lower limb: Secondary | ICD-10-CM

## 2016-01-29 DIAGNOSIS — M2041 Other hammer toe(s) (acquired), right foot: Secondary | ICD-10-CM

## 2016-01-29 MED ORDER — TRIAMCINOLONE ACETONIDE 10 MG/ML IJ SUSP
10.0000 mg | Freq: Once | INTRAMUSCULAR | Status: AC
  Administered 2016-01-29: 10 mg

## 2016-02-01 NOTE — Progress Notes (Signed)
Subjective:     Patient ID: [name], female   DOB: 19-Mar-1956, 60 y.o.   MRN: [id number]  HPI patient states I'm doing much better with my right foot with occasional discomfort but significantly improved   Review of Systems     Objective:   Physical Exam Neurovascular status intact muscle strength adequate with significant reduction of forefoot plantar pain right with good alignment noted and good healing that appears to be occurring in the adjacent digits    Assessment:     An well post screw removal tenotomy and arthroplasty fifth right    Plan:     X-ray reviewed and at this time patient is discharged and allowed to return to normal activities and normal range of motion and normal shoe gear. Reappoint as needed  X-ray report right indicated good healing of osteotomies with good positional component and fifth digit with bone satisfactorily resected

## 2016-02-24 NOTE — Progress Notes (Signed)
HPI The patient returns for evaluation of coronary calcium. This was noted incidentally on a CT of her abdomen chest. She was having some symptoms but cardiac catheterization in 2015 demonstrated no coronary artery disease but there was some calcification proximally extraluminally in the LAD. He was also mention of a slightly enlarged aortic root on a chest CT. This was said to be 4.2 cm. However, echocardiography did not suggest any root enlargement. She has been athletic and has done well. She continues to exercise walking routinely. She has been getting some palpitations. She notices these mostly at night. At times he is every third of every fourth beat. She has been drinking more caffeine under a little more stress as she is putting an addition on her house. She is active and denies any chest pressure, neck or arm discomfort. She's not having any palpitations during the day. She's had no presyncope or syncope. She has no shortness of breath, PND or orthopnea.  Allergies  Allergen Reactions  . Sulfa Antibiotics     Childhood allergy  . Tetanus Toxoids Rash    Pain and swelling at injection site    Current Outpatient Prescriptions  Medication Sig Dispense Refill  . b complex vitamins tablet Take 1 tablet by mouth daily.    . Cholecalciferol 4000 UNITS CAPS Take 4,000 Units by mouth daily.    Marland Kitchen levothyroxine (SYNTHROID, LEVOTHROID) 50 MCG tablet Take 50 mcg by mouth daily before breakfast.    . Multiple Vitamins-Minerals (MULTIVITAMIN PO) Take 1 tablet by mouth daily.    . Omega-3 Fatty Acids (FISH OIL) 1000 MG CAPS Take 1,000 mg by mouth daily.    . traMADol (ULTRAM) 50 MG tablet Take 1 tablet (50 mg total) by mouth every 8 (eight) hours as needed. 45 tablet 1   No current facility-administered medications for this visit.    Past Medical History  Diagnosis Date  . Thyroid disease     Past Surgical History  Procedure Laterality Date  . Metatarsal osteotomy with bunionectomy    .  Aiken osteotomy    . Hammer toe surgery    . Neurectomy foot    . Appendectomy    . Tubal ligation    . Coronary angiogram  07/22/14    normal cors  . Left heart catheterization with coronary angiogram N/A 07/22/2014    Procedure: LEFT HEART CATHETERIZATION WITH CORONARY ANGIOGRAM;  Surgeon: Lennette Bihari, MD;  Location: [facility] CATH LAB;  Service: Cardiovascular;  Laterality: N/A;     ROS:  As stated in the HPI and negative for all other systems.  PHYSICAL EXAM BP 110/72 mmHg  Pulse 48  Ht  (1.676 m)  Wt 123 lb 6.4 oz (55.974 kg)  BMI 19.93 kg/m2 GENERAL:  Well appearing NECK:  No jugular venous distention, waveform within normal limits, carotid upstroke brisk and symmetric, no bruits, no thyromegaly LUNGS:  Clear to auscultation bilaterally BACK:  No CVA tenderness CHEST:  Unremarkable HEART:  PMI not displaced or sustained,S1 and S2 within normal limits, no S3, no S4, no clicks, no rubs, no murmurs ABD:  Flat, positive bowel sounds normal in frequency in pitch, no bruits, no rebound, no guarding, no midline pulsatile mass, no hepatomegaly, no splenomegaly EXT:  2 plus pulses throughout, no edema, no cyanosis no clubbing   EKG:   Sinus bradycardia, rate 48, no acute ST-T wave changes.  02/25/2016   ASSESSMENT AND PLAN  CORONARY CALCIUM:    This was extraluminal. She has  no symptoms. I would plan probably an exercise treadmill routinely in a few years and we have discussed this.   PALPITATIONS:   We discussed these at length. She is first going to cut out her caffeine. If her symptoms don't improve I will put 48 hour Holter monitor on. I will give her a limited dose of Xanax to take only at night if she's having palpitations and cannot get to sleep. She would not be able take her beta blocker with her resting bradycardia.  AO ENLARGEMENT:  This was mildly enlarged in the past I'll plan on repeating a CT size this in a couple of years.

## 2016-02-25 ENCOUNTER — Ambulatory Visit (INDEPENDENT_AMBULATORY_CARE_PROVIDER_SITE_OTHER): Payer: BLUE CROSS/BLUE SHIELD | Admitting: Cardiology

## 2016-02-25 ENCOUNTER — Encounter: Payer: Self-pay | Admitting: Cardiology

## 2016-02-25 VITALS — BP 110/72 | HR 48 | Ht 66.0 in | Wt 123.4 lb

## 2016-02-25 DIAGNOSIS — R072 Precordial pain: Secondary | ICD-10-CM | POA: Diagnosis not present

## 2016-02-25 MED ORDER — ALPRAZOLAM 0.5 MG PO TABS: 0.5000 mg | ORAL_TABLET | Freq: Every evening | ORAL | Status: DC | PRN

## 2016-02-25 NOTE — Patient Instructions (Signed)
Your physician wants you to follow-up in: 3 Years. You will receive a reminder letter in the mail two months in advance. If you don't receive a letter, please call our office to schedule the follow-up appointment.

## 2016-03-09 ENCOUNTER — Other Ambulatory Visit: Payer: Self-pay | Admitting: Obstetrics and Gynecology

## 2016-03-09 ENCOUNTER — Other Ambulatory Visit (HOSPITAL_COMMUNITY)
Admission: RE | Admit: 2016-03-09 | Discharge: 2016-03-09 | Disposition: A | Discharge status: Home / Self Care | Payer: BLUE CROSS/BLUE SHIELD | Source: Ambulatory Visit | Attending: Obstetrics and Gynecology | Admitting: Obstetrics and Gynecology

## 2016-03-09 DIAGNOSIS — Z01419 Encounter for gynecological examination (general) (routine) without abnormal findings: Secondary | ICD-10-CM | POA: Insufficient documentation

## 2016-03-09 DIAGNOSIS — Z1151 Encounter for screening for human papillomavirus (HPV): Secondary | ICD-10-CM | POA: Insufficient documentation

## 2016-03-10 LAB — CYTOLOGY - PAP

## 2016-03-16 DIAGNOSIS — E039 Hypothyroidism, unspecified: Secondary | ICD-10-CM | POA: Diagnosis not present

## 2016-03-16 DIAGNOSIS — R350 Frequency of micturition: Secondary | ICD-10-CM | POA: Diagnosis not present

## 2016-03-16 DIAGNOSIS — Z0000 Encounter for general adult medical examination without abnormal findings: Secondary | ICD-10-CM | POA: Diagnosis not present

## 2016-03-17 DIAGNOSIS — R634 Abnormal weight loss: Secondary | ICD-10-CM | POA: Diagnosis not present

## 2016-03-17 DIAGNOSIS — R1909 Other intra-abdominal and pelvic swelling, mass and lump: Secondary | ICD-10-CM | POA: Diagnosis not present

## 2016-03-21 DIAGNOSIS — L700 Acne vulgaris: Secondary | ICD-10-CM | POA: Diagnosis not present

## 2016-03-21 DIAGNOSIS — L814 Other melanin hyperpigmentation: Secondary | ICD-10-CM | POA: Diagnosis not present

## 2016-03-21 DIAGNOSIS — D1801 Hemangioma of skin and subcutaneous tissue: Secondary | ICD-10-CM | POA: Diagnosis not present

## 2016-03-21 DIAGNOSIS — Z85828 Personal history of other malignant neoplasm of skin: Secondary | ICD-10-CM | POA: Diagnosis not present

## 2016-03-28 DIAGNOSIS — N949 Unspecified condition associated with female genital organs and menstrual cycle: Secondary | ICD-10-CM | POA: Diagnosis not present

## 2016-05-11 DIAGNOSIS — H524 Presbyopia: Secondary | ICD-10-CM | POA: Diagnosis not present

## 2016-05-11 DIAGNOSIS — H2513 Age-related nuclear cataract, bilateral: Secondary | ICD-10-CM | POA: Diagnosis not present

## 2016-05-11 DIAGNOSIS — H52203 Unspecified astigmatism, bilateral: Secondary | ICD-10-CM | POA: Diagnosis not present

## 2016-05-24 DIAGNOSIS — S70362A Insect bite (nonvenomous), left thigh, initial encounter: Secondary | ICD-10-CM | POA: Diagnosis not present

## 2016-06-08 ENCOUNTER — Ambulatory Visit (INDEPENDENT_AMBULATORY_CARE_PROVIDER_SITE_OTHER): Payer: BLUE CROSS/BLUE SHIELD

## 2016-06-08 ENCOUNTER — Encounter: Payer: Self-pay | Admitting: Podiatry

## 2016-06-08 ENCOUNTER — Ambulatory Visit (INDEPENDENT_AMBULATORY_CARE_PROVIDER_SITE_OTHER): Payer: BLUE CROSS/BLUE SHIELD | Admitting: Podiatry

## 2016-06-08 DIAGNOSIS — Z9889 Other specified postprocedural states: Secondary | ICD-10-CM | POA: Diagnosis not present

## 2016-06-08 DIAGNOSIS — M2041 Other hammer toe(s) (acquired), right foot: Secondary | ICD-10-CM

## 2016-06-08 DIAGNOSIS — L84 Corns and callosities: Secondary | ICD-10-CM | POA: Diagnosis not present

## 2016-06-08 NOTE — Progress Notes (Signed)
Subjective:     Patient ID: [name], female   DOB: 12-05-1956, 60 y.o.   MRN: [id number]  HPI patient presents stating I started develop pain in the bottom my right foot last week and I have been barefoot a lot and I have lesions on the bottom my fifth metatarsal of both feet   Review of Systems     Objective:   Physical Exam Neurovascular status intact muscle strength adequate with patient found to have keratotic lesion sub-fifth metatarsals bilateral with pain and mild discomfort in the right forefoot localized in nature with excellent healing of previous surgical sites    Assessment:     Appears to be more inflammatory due to change in activity levels along with keratotic lesions    Plan:     H&P conditions reviewed and debrided lesions at this time. I instructed on wider-type shoes and that the other pain should get better over time and she utilize Aleve. Reappoint if symptoms persist  X-ray report indicates the osteotomy is healed well everything is in good alignment and the digits are in good alignment

## 2016-07-29 DIAGNOSIS — D509 Iron deficiency anemia, unspecified: Secondary | ICD-10-CM | POA: Diagnosis not present

## 2016-08-21 DIAGNOSIS — S61217A Laceration without foreign body of left little finger without damage to nail, initial encounter: Secondary | ICD-10-CM | POA: Diagnosis not present

## 2016-08-21 DIAGNOSIS — W25XXXA Contact with sharp glass, initial encounter: Secondary | ICD-10-CM | POA: Diagnosis not present

## 2016-08-22 DIAGNOSIS — E039 Hypothyroidism, unspecified: Secondary | ICD-10-CM | POA: Diagnosis not present

## 2016-09-02 DIAGNOSIS — Z4802 Encounter for removal of sutures: Secondary | ICD-10-CM | POA: Diagnosis not present

## 2016-11-01 DIAGNOSIS — D485 Neoplasm of uncertain behavior of skin: Secondary | ICD-10-CM | POA: Diagnosis not present

## 2016-11-01 DIAGNOSIS — L089 Local infection of the skin and subcutaneous tissue, unspecified: Secondary | ICD-10-CM | POA: Diagnosis not present

## 2016-11-18 DIAGNOSIS — K210 Gastro-esophageal reflux disease with esophagitis: Secondary | ICD-10-CM | POA: Diagnosis not present

## 2016-11-18 DIAGNOSIS — Z86010 Personal history of colonic polyps: Secondary | ICD-10-CM | POA: Diagnosis not present

## 2016-12-20 DIAGNOSIS — H9313 Tinnitus, bilateral: Secondary | ICD-10-CM | POA: Diagnosis not present

## 2016-12-20 DIAGNOSIS — H6993 Unspecified Eustachian tube disorder, bilateral: Secondary | ICD-10-CM | POA: Diagnosis not present

## 2016-12-20 DIAGNOSIS — J310 Chronic rhinitis: Secondary | ICD-10-CM | POA: Diagnosis not present

## 2017-02-06 DIAGNOSIS — K219 Gastro-esophageal reflux disease without esophagitis: Secondary | ICD-10-CM | POA: Diagnosis not present

## 2017-02-06 DIAGNOSIS — K648 Other hemorrhoids: Secondary | ICD-10-CM | POA: Diagnosis not present

## 2017-02-06 DIAGNOSIS — Z86010 Personal history of colonic polyps: Secondary | ICD-10-CM | POA: Diagnosis not present

## 2017-02-06 DIAGNOSIS — K5730 Diverticulosis of large intestine without perforation or abscess without bleeding: Secondary | ICD-10-CM | POA: Diagnosis not present

## 2017-02-06 DIAGNOSIS — K644 Residual hemorrhoidal skin tags: Secondary | ICD-10-CM | POA: Diagnosis not present

## 2017-02-06 DIAGNOSIS — K449 Diaphragmatic hernia without obstruction or gangrene: Secondary | ICD-10-CM | POA: Diagnosis not present

## 2017-02-06 DIAGNOSIS — Z800 Family history of malignant neoplasm of digestive organs: Secondary | ICD-10-CM | POA: Diagnosis not present

## 2017-02-06 DIAGNOSIS — K228 Other specified diseases of esophagus: Secondary | ICD-10-CM | POA: Diagnosis not present

## 2017-02-09 DIAGNOSIS — K219 Gastro-esophageal reflux disease without esophagitis: Secondary | ICD-10-CM | POA: Diagnosis not present

## 2017-06-02 DIAGNOSIS — D2271 Melanocytic nevi of right lower limb, including hip: Secondary | ICD-10-CM | POA: Diagnosis not present

## 2017-06-02 DIAGNOSIS — D2261 Melanocytic nevi of right upper limb, including shoulder: Secondary | ICD-10-CM | POA: Diagnosis not present

## 2017-06-02 DIAGNOSIS — D225 Melanocytic nevi of trunk: Secondary | ICD-10-CM | POA: Diagnosis not present

## 2017-06-02 DIAGNOSIS — Z85828 Personal history of other malignant neoplasm of skin: Secondary | ICD-10-CM | POA: Diagnosis not present

## 2017-06-08 ENCOUNTER — Encounter: Payer: Self-pay | Admitting: Podiatry

## 2017-06-08 ENCOUNTER — Ambulatory Visit (INDEPENDENT_AMBULATORY_CARE_PROVIDER_SITE_OTHER): Payer: BLUE CROSS/BLUE SHIELD

## 2017-06-08 ENCOUNTER — Ambulatory Visit (INDEPENDENT_AMBULATORY_CARE_PROVIDER_SITE_OTHER): Payer: BLUE CROSS/BLUE SHIELD | Admitting: Podiatry

## 2017-06-08 VITALS — BP 102/69 | HR 50 | Resp 16

## 2017-06-08 DIAGNOSIS — M778 Other enthesopathies, not elsewhere classified: Secondary | ICD-10-CM

## 2017-06-08 DIAGNOSIS — M7751 Other enthesopathy of right foot: Secondary | ICD-10-CM

## 2017-06-08 DIAGNOSIS — M779 Enthesopathy, unspecified: Secondary | ICD-10-CM

## 2017-06-08 DIAGNOSIS — M79671 Pain in right foot: Secondary | ICD-10-CM

## 2017-06-08 MED ORDER — TRIAMCINOLONE ACETONIDE 10 MG/ML IJ SUSP
10.0000 mg | Freq: Once | INTRAMUSCULAR | Status: AC
  Administered 2017-06-08: 10 mg

## 2017-06-09 ENCOUNTER — Ambulatory Visit: Payer: BLUE CROSS/BLUE SHIELD | Admitting: Podiatry

## 2017-06-09 NOTE — Progress Notes (Signed)
Subjective:    Patient ID: [name], female   DOB: 61 y.o.   MRN: [id number]   HPI patient states that recently she has developed a lot of discomfort in the plantar of the right foot with what appears to be a protruding bone and fluid buildup. Does not remember specific injury and it's been occurring over the last few months    ROS      Objective:  Physical Exam neurovascular status intact negative Homan sign was noted with patient found to have inflammation fluid around the third MPJ right with what appears to be plantar protrusion of the metatarsal. Patient's noted to have quite a bit of pain around this area with fluid buildup     Assessment:    Inflammatory capsulitis third MPJ right with possibility for a lowering of the metatarsal which may be causing pain with ambulation     Plan:     H&P conditions reviewed and today I did a proximal nerve block and then aspirated the joint getting out a small amount of serous fluid and injected with quarter cc deck Smith some Kenalog and applied a cushion around the area to take weight off of it. Discussed change and orthotics or other modalities depending on response  X-rays indicate that the bone structure itself looks good but it does appear there may be plantar flexion third metatarsal right

## 2017-06-22 ENCOUNTER — Telehealth: Payer: Self-pay | Admitting: *Deleted

## 2017-06-22 ENCOUNTER — Ambulatory Visit (INDEPENDENT_AMBULATORY_CARE_PROVIDER_SITE_OTHER): Payer: BLUE CROSS/BLUE SHIELD | Admitting: Podiatry

## 2017-06-22 DIAGNOSIS — G5761 Lesion of plantar nerve, right lower limb: Secondary | ICD-10-CM | POA: Diagnosis not present

## 2017-06-22 DIAGNOSIS — M779 Enthesopathy, unspecified: Secondary | ICD-10-CM

## 2017-06-22 DIAGNOSIS — G5781 Other specified mononeuropathies of right lower limb: Secondary | ICD-10-CM

## 2017-06-22 MED ORDER — TRAMADOL HCL 50 MG PO TABS: 50.0000 mg | ORAL_TABLET | Freq: Three times a day (TID) | ORAL | 2 refills | Status: DC

## 2017-06-22 NOTE — Telephone Encounter (Signed)
Left message informing pt I had faxed [id number].

## 2017-06-23 NOTE — Progress Notes (Signed)
Subjective:    Patient ID: [name], female   DOB: 61 y.o.   MRN: [id number]   HPI patient continues to experience pain in the plantar aspect of the foot and states also she's noted significant shooting in the third interspace of the right foot with radiating discomfort. Still feels like she's walking on the bone and padding helped    ROS      Objective:  Physical Exam neurovascular status intact with prominent metatarsal of the third metatarsal right with continued discomfort but quite a bit of pain third interspace right with distal shooting noted     Assessment:    Probable plantar flexion of the third metatarsal right which could be spontaneous she feel it happened suddenly along with possibility for neuroma symptomatology right     Plan:    H&P condition reviewed and at this time I injected the nerve with a dura 5 alcohol Marcaine solution which was tolerated well and I want to see the response to this we discussed ultimately this may require elevating osteotomy

## 2017-07-14 ENCOUNTER — Ambulatory Visit (INDEPENDENT_AMBULATORY_CARE_PROVIDER_SITE_OTHER): Payer: BLUE CROSS/BLUE SHIELD | Admitting: Podiatry

## 2017-07-14 ENCOUNTER — Encounter: Payer: Self-pay | Admitting: Podiatry

## 2017-07-14 DIAGNOSIS — G5761 Lesion of plantar nerve, right lower limb: Secondary | ICD-10-CM

## 2017-07-14 DIAGNOSIS — M779 Enthesopathy, unspecified: Secondary | ICD-10-CM | POA: Diagnosis not present

## 2017-07-14 DIAGNOSIS — G5781 Other specified mononeuropathies of right lower limb: Secondary | ICD-10-CM

## 2017-07-14 NOTE — Progress Notes (Signed)
Subjective:    Patient ID: [name], female   DOB: 61 y.o.   MRN: [id number]   HPI patient states that she seems to be feeling quite a bit better with occasional discomfort    ROS      Objective:  Physical Exam neurovascular status intact with diminished discomfort of the right forefoot secondary to neuro injection     Assessment:   Appears her problem is neuroma but seems to responded to neuro lysis treatment     Plan:   Do not recommend further treatment and less symptoms were to get worse but I did explain that a lot of times it takes a series of medicines to get this better

## 2017-08-16 ENCOUNTER — Ambulatory Visit (INDEPENDENT_AMBULATORY_CARE_PROVIDER_SITE_OTHER): Payer: BLUE CROSS/BLUE SHIELD

## 2017-08-16 ENCOUNTER — Ambulatory Visit (INDEPENDENT_AMBULATORY_CARE_PROVIDER_SITE_OTHER): Payer: BLUE CROSS/BLUE SHIELD | Admitting: Podiatry

## 2017-08-16 ENCOUNTER — Encounter: Payer: Self-pay | Admitting: Podiatry

## 2017-08-16 DIAGNOSIS — Z9889 Other specified postprocedural states: Secondary | ICD-10-CM | POA: Diagnosis not present

## 2017-08-16 DIAGNOSIS — G5761 Lesion of plantar nerve, right lower limb: Secondary | ICD-10-CM

## 2017-08-16 DIAGNOSIS — Z472 Encounter for removal of internal fixation device: Secondary | ICD-10-CM | POA: Diagnosis not present

## 2017-08-16 DIAGNOSIS — G5781 Other specified mononeuropathies of right lower limb: Secondary | ICD-10-CM

## 2017-08-16 MED ORDER — TRAMADOL HCL 50 MG PO TABS: 50.0000 mg | ORAL_TABLET | Freq: Three times a day (TID) | ORAL | 0 refills | Status: DC | PRN

## 2017-08-16 NOTE — Progress Notes (Signed)
Subjective:    Patient ID: [name], female   DOB: 61 y.o.   MRN: [id number]   HPI patient presents stating she's developed a lot of pain on top of her right foot and she's not sure what happened but it just started in the last week    ROS      Objective:  Physical Exam neurovascular status intact with patient having significant prominence of the metatarsal shaft where there is what appears to be the proximal pin which may have popped in a dorsal direction     Assessment:    Abnormal pin position right which is creating intense discomfort for patient     Plan:    H&P x-ray reviewed and today I have recommended pin removal and explained procedure and risk. Patient wants the procedure and I allowed her to read consent form for correction explaining the procedure and will would be required and patient after review signs consent form understanding surgery necessary. Due to the intense discomfort we are able to get her in to the office in the next several days for the surgical procedure  X-ray indicates that the marker over the area is directly over the proximal pin position

## 2017-08-17 ENCOUNTER — Encounter: Payer: Self-pay | Admitting: Podiatry

## 2017-08-17 ENCOUNTER — Ambulatory Visit (INDEPENDENT_AMBULATORY_CARE_PROVIDER_SITE_OTHER): Payer: BLUE CROSS/BLUE SHIELD | Admitting: Podiatry

## 2017-08-17 VITALS — BP 117/73 | HR 51 | Resp 16

## 2017-08-17 DIAGNOSIS — Z472 Encounter for removal of internal fixation device: Secondary | ICD-10-CM | POA: Diagnosis not present

## 2017-08-17 NOTE — Progress Notes (Signed)
Subjective:    Patient ID: [name], female   DOB: 61 y.o.   MRN: [id number]   HPI patient presents with very painful prominent pin first metatarsal proximal shaft    ROS      Objective:  Physical Exam neurovascular status intact with patient found to have a prominent pin in the dorsum of the right first metatarsal with inflammation and pain surrounding     Assessment:    Acute lifting of the pin right with inflammatory changes     Plan:    Injected with 60 Milligan times like Marcaine mixture and brought patient to the OR and did sterile prep to the right forefoot. I then applied ankle tourniquet after exsanguinating the foot and inflated tourniquet to 250 mmHg. Following procedure was performed. Attention was directed dorsal aspect right foot where a 3 cm incision was made centered over the offending pin position. The incision was deepened through subcutaneous tissues tissue down to capsule with a linear Incision Was Made in the Center Tissue Was Sharply Dissected off the Underlying Bone Revealing Prominent Pin. Utilizing Hemostats the Pin Was Removed In Toto the Wound Was Flushed with Copious about Garamycin Solution and Was Sutured with 5-0 Nylon and Sterile Dressing Applied. Tourniquet Released Capillary Fill Noted to Be Immediate and Patient Tolerated Procedure Well

## 2017-08-18 ENCOUNTER — Telehealth: Payer: Self-pay | Admitting: *Deleted

## 2017-08-18 MED ORDER — HYDROCODONE-ACETAMINOPHEN 5-325 MG PO TABS: 1.0000 | ORAL_TABLET | Freq: Four times a day (QID) | ORAL | 0 refills | Status: DC | PRN

## 2017-08-18 NOTE — Telephone Encounter (Signed)
Pt states her incision site burns, and the side of her foot hurts. I told pt to remove ace wrap, cut coflex and gently loosen gauze and I would see if doctor-on-call would write for a light narcotic. I told pt it sounded like she had been on her foot too much to only weight bear 15 minutes/hour for the 1st 3-7 days and to ice 15 minutes every hour at least for 1st 3-7 days and to stop by the office to pick rx. I asked if she could take ibuprofen, and she states at last surgery she took ibuprofen and ended up in the hospital with liver problems. I told pt to [facility] to pick up the pain medication rx. Dr. Samuella CotaPrice ordered Vicodin 5/325mg  one tablet every 6 hours #20.

## 2017-08-24 DIAGNOSIS — F419 Anxiety disorder, unspecified: Secondary | ICD-10-CM | POA: Diagnosis not present

## 2017-08-24 DIAGNOSIS — Z23 Encounter for immunization: Secondary | ICD-10-CM | POA: Diagnosis not present

## 2017-08-24 DIAGNOSIS — E039 Hypothyroidism, unspecified: Secondary | ICD-10-CM | POA: Diagnosis not present

## 2017-08-24 DIAGNOSIS — R002 Palpitations: Secondary | ICD-10-CM | POA: Diagnosis not present

## 2017-08-28 ENCOUNTER — Emergency Department (HOSPITAL_COMMUNITY): Payer: BLUE CROSS/BLUE SHIELD

## 2017-08-28 ENCOUNTER — Emergency Department (HOSPITAL_COMMUNITY)
Admission: EM | Admit: 2017-08-28 | Discharge: 2017-08-28 | Disposition: A | Discharge status: Home / Self Care | Payer: BLUE CROSS/BLUE SHIELD | Attending: Emergency Medicine | Admitting: Emergency Medicine

## 2017-08-28 ENCOUNTER — Encounter (HOSPITAL_COMMUNITY): Payer: Self-pay

## 2017-08-28 DIAGNOSIS — R319 Hematuria, unspecified: Secondary | ICD-10-CM | POA: Insufficient documentation

## 2017-08-28 DIAGNOSIS — M549 Dorsalgia, unspecified: Secondary | ICD-10-CM | POA: Diagnosis not present

## 2017-08-28 DIAGNOSIS — R42 Dizziness and giddiness: Secondary | ICD-10-CM | POA: Diagnosis not present

## 2017-08-28 DIAGNOSIS — R109 Unspecified abdominal pain: Secondary | ICD-10-CM | POA: Diagnosis not present

## 2017-08-28 DIAGNOSIS — Z79899 Other long term (current) drug therapy: Secondary | ICD-10-CM | POA: Insufficient documentation

## 2017-08-28 LAB — BASIC METABOLIC PANEL
Anion gap: 8 (ref 5–15)
BUN: 13 mg/dL (ref 6–20)
CO2: 26 mmol/L (ref 22–32)
Calcium: 9.1 mg/dL (ref 8.9–10.3)
Chloride: 106 mmol/L (ref 101–111)
Creatinine, Ser: 0.61 mg/dL (ref 0.44–1.00)
GFR calc Af Amer: >60 mL/min (ref >60)
GFR calc non Af Amer: >60 mL/min (ref >60)
Glucose, Bld: 104 mg/dL — ABNORMAL HIGH (ref 65–99)
Potassium: 3.6 mmol/L (ref 3.5–5.1)
Sodium: 140 mmol/L (ref 135–145)

## 2017-08-28 LAB — CBC WITH DIFFERENTIAL/PLATELET
Basophils Absolute: 0 10*3/uL (ref 0.0–0.1)
Basophils Relative: 0 %
Eosinophils Absolute: 0 10*3/uL (ref 0.0–0.7)
Eosinophils Relative: 1 %
HCT: 38.5 % (ref 36.0–46.0)
Hemoglobin: 13.4 g/dL (ref 12.0–15.0)
Lymphocytes Relative: 28 %
Lymphs Abs: 1.3 10*3/uL (ref 0.7–4.0)
MCH: 32.4 pg (ref 26.0–34.0)
MCHC: 34.8 g/dL (ref 30.0–36.0)
MCV: 93.2 fL (ref 78.0–100.0)
Monocytes Absolute: 0.3 10*3/uL (ref 0.1–1.0)
Monocytes Relative: 7 %
Neutro Abs: 2.9 10*3/uL (ref 1.7–7.7)
Neutrophils Relative %: 64 %
Platelets: 228 10*3/uL (ref 150–400)
RBC: 4.13 MIL/uL (ref 3.87–5.11)
RDW: 12.3 % (ref 11.5–15.5)
WBC: 4.6 10*3/uL (ref 4.0–10.5)

## 2017-08-28 LAB — URINALYSIS, ROUTINE W REFLEX MICROSCOPIC
Bacteria, UA: NONE SEEN
Bilirubin Urine: NEGATIVE
Glucose, UA: NEGATIVE mg/dL
Ketones, ur: NEGATIVE mg/dL
Leukocytes, UA: NEGATIVE
Nitrite: NEGATIVE
Protein, ur: NEGATIVE mg/dL
Specific Gravity, Urine: 1.005 (ref 1.005–1.030)
Squamous Epithelial / LPF: NONE SEEN
pH: 6 (ref 5.0–8.0)

## 2017-08-28 MED ORDER — MECLIZINE HCL 25 MG PO TABS: 25.0000 mg | ORAL_TABLET | Freq: Three times a day (TID) | ORAL | 0 refills | Status: DC | PRN

## 2017-08-28 MED ORDER — ONDANSETRON HCL 4 MG/2ML IJ SOLN
4.0000 mg | Freq: Once | INTRAMUSCULAR | Status: AC
  Administered 2017-08-28: 4 mg via INTRAVENOUS
  Filled 2017-08-28: qty 2

## 2017-08-28 MED ORDER — MECLIZINE HCL 25 MG PO TABS
25.0000 mg | ORAL_TABLET | Freq: Once | ORAL | Status: AC
  Administered 2017-08-28: 25 mg via ORAL
  Filled 2017-08-28: qty 1

## 2017-08-28 MED ORDER — SODIUM CHLORIDE 0.9 % IV BOLUS (SEPSIS)
500.0000 mL | Freq: Once | INTRAVENOUS | Status: AC
  Administered 2017-08-28: 500 mL via INTRAVENOUS

## 2017-08-28 MED ORDER — KETOROLAC TROMETHAMINE 30 MG/ML IJ SOLN
15.0000 mg | Freq: Once | INTRAMUSCULAR | Status: AC
  Administered 2017-08-28: 15 mg via INTRAVENOUS
  Filled 2017-08-28: qty 1

## 2017-08-28 NOTE — ED Triage Notes (Addendum)
Patient c/o vertigo and vomiting yesterday and mid back pain started this AM.  Patient added at the end of triage that she had a slight headache and had floaters of the left eye.

## 2017-08-28 NOTE — Discharge Instructions (Signed)
Follow-up with your primary doctor and urology and ENT as needed

## 2017-08-28 NOTE — ED Provider Notes (Signed)
[facility]-EMERGENCY DEPT Provider Note   CSN: [id number] Arrival date & time: 08/28/17  0735     History   Chief Complaint Chief Complaint  Patient presents with  . Dizziness  . Back Pain  . Emesis  . Headache    HPI [name] is a 61 y.o. female.  HPI Patient presents with vertigo nausea and vomiting. Began yesterday. Also had slight dull headache and some floaters in her left eye. States fluid is not unusual for her. He has had some mild vertigo in the past but not like this. States she has ringing in her ears. Also has some left back/chest pain. States this bothers her more than the headache. No localizing numbness or weakness. Has been unsteady with walking. No loss of hearing. No dysuria or blood in the urine. No difficulty breathing. States she has lost around 7 pounds over last few months. Thinks it could do distress. States she had her thyroid checked and it was normal. Past Medical History:  Diagnosis Date  . Thyroid disease     Patient Active Problem List   Diagnosis Date Noted  . Abnormal cardiac findings on abd CT- (cardiomegaly) May 2015 08/12/2014  . Cardiomegaly 07/10/2014  . Precordial chest pain 07/10/2014  . Tarsal tunnel syndrome 05/23/2012  . Bunion of great toe of right foot 05/23/2012    Past Surgical History:  Procedure Laterality Date  . AIKEN OSTEOTOMY    . APPENDECTOMY    . CORONARY ANGIOGRAM  07/22/14   normal cors  . HAMMER TOE SURGERY    . LAPAROSCOPIC OOPHERECTOMY    . LEFT HEART CATHETERIZATION WITH CORONARY ANGIOGRAM N/A 07/22/2014   Procedure: LEFT HEART CATHETERIZATION WITH CORONARY ANGIOGRAM;  Surgeon: Lennette Bihari, MD;  Location: [facility] CATH LAB;  Service: Cardiovascular;  Laterality: N/A;  . METATARSAL OSTEOTOMY WITH BUNIONECTOMY    . NASAL SINUS SURGERY    . NEURECTOMY FOOT    . TUBAL LIGATION    . TUBAL LIGATION      OB History    No data available       Home Medications    Prior to Admission medications   Medication Sig  Start Date End Date Taking? Authorizing Provider  levothyroxine (SYNTHROID, LEVOTHROID) 50 MCG tablet Take 50 mcg by mouth daily before breakfast.   Yes [provider]  Multiple Vitamin (MULTIVITAMIN WITH MINERALS) TABS tablet Take 1 tablet by mouth daily.   Yes [provider]  Omega-3 Fatty Acids (FISH OIL) 1000 MG CAPS Take 1,000 mg by mouth daily.   Yes [provider]  traMADol (ULTRAM) 50 MG tablet Take 1 tablet (50 mg total) by mouth 3 (three) times daily. 06/22/17  Yes Regal, Kirstie Peri, DPM  HYDROcodone-acetaminophen (NORCO/VICODIN) 5-325 MG tablet Take 1 tablet by mouth every 6 (six) hours as needed for moderate pain. Patient not taking: Reported on 08/28/2017 08/18/17   Park Liter, DPM  meclizine (ANTIVERT) 25 MG tablet Take 1 tablet (25 mg total) by mouth 3 (three) times daily as needed for dizziness. 08/28/17   Benjiman Core, MD    Family History Family History  Problem Relation Age of Onset  . Cancer Father   . Heart disease Father        Pacemaker  . Alzheimer's disease Mother   . Congenital heart disease Son        Coarc    Social History Social History  Substance Use Topics  . Smoking status: Never Smoker  . Smokeless  tobacco: Never Used  . Alcohol use Yes     Comment: occ     Allergies   Sulfa antibiotics and Tetanus toxoids   Review of Systems Review of Systems  Constitutional: Positive for appetite change. Negative for fever.  HENT: Positive for tinnitus. Negative for congestion.   Eyes: Positive for visual disturbance.  Respiratory: Negative for cough.   Gastrointestinal: Negative for abdominal distention.  Genitourinary: Negative for flank pain and hematuria.  Musculoskeletal: Positive for back pain.  Neurological: Positive for dizziness and headaches.  Hematological: Negative for adenopathy.  Psychiatric/Behavioral: Negative for confusion.     Physical Exam Updated Vital Signs BP (!) 141/81   Pulse (!) 49    Temp 97.9 F (36.6 C)   Resp 10   Ht 5' 5.5" (1.664 m)   Wt 54 kg (119 lb)   SpO2 99%   BMI 19.50 kg/m   Physical Exam  Constitutional: She is oriented to person, place, and time. She appears well-developed.  HENT:  Head: Atraumatic.  Eyes: Pupils are equal, round, and reactive to light.  Mild nystagmus with gaze to left.  Neck: Neck supple.  Cardiovascular: Normal rate.   Pulmonary/Chest: Effort normal.  Abdominal: Soft.  Musculoskeletal: She exhibits no edema.  Neurological: She is alert and oriented to person, place, and time.  Slightly unsteady with romberg testing. Normal finger nose and heel shin  Skin: Skin is warm. Capillary refill takes less than 2 seconds.     ED Treatments / Results  Labs (all labs ordered are listed, but only abnormal results are displayed) Labs Reviewed  URINALYSIS, ROUTINE W REFLEX MICROSCOPIC - Abnormal; Notable for the following:       Result Value   Color, Urine STRAW (*)    Hgb urine dipstick SMALL (*)    All other components within normal limits  BASIC METABOLIC PANEL - Abnormal; Notable for the following:    Glucose, Bld 104 (*)    All other components within normal limits  CBC WITH DIFFERENTIAL/PLATELET    EKG  EKG Interpretation None       Radiology Dg Chest 2 View  Result Date: 08/28/2017 CLINICAL DATA:  Recent vertigo and vomiting EXAM: CHEST  2 VIEW COMPARISON:  07/22/2014 FINDINGS: Cardiac shadow is within normal limits. The lungs are well aerated bilaterally. No focal infiltrate or sizable effusion is seen. No acute bony abnormality is noted. IMPRESSION: No active cardiopulmonary disease. Electronically Signed   By: Alcide Clever M.D.   On: 08/28/2017 09:56    Procedures Procedures (including critical care time)  Medications Ordered in ED Medications  ondansetron (ZOFRAN) injection 4 mg (4 mg Intravenous Given 08/28/17 0917)  meclizine (ANTIVERT) tablet 25 mg (25 mg Oral Given 08/28/17 0921)  sodium chloride 0.9 %  bolus 500 mL (0 mLs Intravenous Stopped 08/28/17 1030)  ketorolac (TORADOL) 30 MG/ML injection 15 mg (15 mg Intravenous Given 08/28/17 1007)     Initial Impression / Assessment and Plan / ED Course  I have reviewed the triage vital signs and the nursing notes.  Pertinent labs & imaging results that were available during my care of the patient were reviewed by me and considered in my medical decision making (see chart for details).     Patient presents with vertigo. I think is likely peripheral with her tinnitus that accompanies it. Slightly unsteady with Romberg but otherwise normal neuro exam. Feels better after Antivert. Also has left flank pain. Does have small amount hematuria with patient states that she's  had a past. She can follow-up with urology as an outpatient. Pain improved and kidney stone considered but pain is controlled. Will discharge home.  Final Clinical Impressions(s) / ED Diagnoses   Final diagnoses:  Vertigo  Hematuria, unspecified type  Flank pain    New Prescriptions New Prescriptions   MECLIZINE (ANTIVERT) 25 MG TABLET    Take 1 tablet (25 mg total) by mouth 3 (three) times daily as needed for dizziness.     Benjiman Core, MD 08/28/17 1043

## 2017-08-31 DIAGNOSIS — R319 Hematuria, unspecified: Secondary | ICD-10-CM | POA: Diagnosis not present

## 2017-08-31 DIAGNOSIS — R42 Dizziness and giddiness: Secondary | ICD-10-CM | POA: Diagnosis not present

## 2017-08-31 DIAGNOSIS — Z8051 Family history of malignant neoplasm of kidney: Secondary | ICD-10-CM | POA: Diagnosis not present

## 2017-08-31 DIAGNOSIS — R634 Abnormal weight loss: Secondary | ICD-10-CM | POA: Diagnosis not present

## 2017-09-01 ENCOUNTER — Ambulatory Visit (INDEPENDENT_AMBULATORY_CARE_PROVIDER_SITE_OTHER): Payer: BLUE CROSS/BLUE SHIELD | Admitting: Podiatry

## 2017-09-01 ENCOUNTER — Encounter: Payer: Self-pay | Admitting: Podiatry

## 2017-09-01 ENCOUNTER — Ambulatory Visit (INDEPENDENT_AMBULATORY_CARE_PROVIDER_SITE_OTHER): Payer: BLUE CROSS/BLUE SHIELD

## 2017-09-01 ENCOUNTER — Other Ambulatory Visit: Payer: Self-pay | Admitting: Family Medicine

## 2017-09-01 VITALS — BP 125/83 | HR 52 | Temp 98.8°F

## 2017-09-01 DIAGNOSIS — G5761 Lesion of plantar nerve, right lower limb: Secondary | ICD-10-CM | POA: Diagnosis not present

## 2017-09-01 DIAGNOSIS — G5781 Other specified mononeuropathies of right lower limb: Secondary | ICD-10-CM

## 2017-09-01 DIAGNOSIS — Z9889 Other specified postprocedural states: Secondary | ICD-10-CM

## 2017-09-01 DIAGNOSIS — R634 Abnormal weight loss: Secondary | ICD-10-CM

## 2017-09-01 NOTE — Progress Notes (Signed)
Subjective:    Patient ID: [name], female   DOB: 62 y.o.   MRN: [id number]   HPI patient states doing well with her foot with minimal discomfort and able to wear shoe gear    ROS      Objective:  Physical Exam neurovascular status intact with patient's right foot healing well with wound edges well coapted and minimal superficial gapping of the proximal portion incision     Assessment:  Doing well with slight gapping with no redness erythema or drainage of the right incision site       Plan:    Sterile dressing applied and instructed on gradual return to normal activities. Reappoint to recheck as needed  X-rays indicate satisfactory removal of pin with no indications of pathology

## 2017-09-05 DIAGNOSIS — H8123 Vestibular neuronitis, bilateral: Secondary | ICD-10-CM | POA: Diagnosis not present

## 2017-09-05 DIAGNOSIS — R42 Dizziness and giddiness: Secondary | ICD-10-CM | POA: Diagnosis not present

## 2017-09-08 ENCOUNTER — Ambulatory Visit
Admission: RE | Admit: 2017-09-08 | Discharge: 2017-09-08 | Disposition: A | Discharge status: Home / Self Care | Payer: BLUE CROSS/BLUE SHIELD | Source: Ambulatory Visit | Attending: Family Medicine | Admitting: Family Medicine

## 2017-09-08 DIAGNOSIS — R319 Hematuria, unspecified: Secondary | ICD-10-CM | POA: Diagnosis not present

## 2017-09-08 DIAGNOSIS — R634 Abnormal weight loss: Secondary | ICD-10-CM

## 2017-09-08 MED ORDER — IOPAMIDOL (ISOVUE-300) INJECTION 61%
100.0000 mL | Freq: Once | INTRAVENOUS | Status: AC | PRN
  Administered 2017-09-08: 100 mL via INTRAVENOUS

## 2017-10-02 DIAGNOSIS — R3129 Other microscopic hematuria: Secondary | ICD-10-CM | POA: Diagnosis not present

## 2018-01-12 DIAGNOSIS — Z1231 Encounter for screening mammogram for malignant neoplasm of breast: Secondary | ICD-10-CM | POA: Diagnosis not present

## 2018-02-21 DIAGNOSIS — R4789 Other speech disturbances: Secondary | ICD-10-CM | POA: Diagnosis not present

## 2018-02-21 DIAGNOSIS — R42 Dizziness and giddiness: Secondary | ICD-10-CM | POA: Diagnosis not present

## 2018-02-21 DIAGNOSIS — E039 Hypothyroidism, unspecified: Secondary | ICD-10-CM | POA: Diagnosis not present

## 2018-02-21 DIAGNOSIS — E559 Vitamin D deficiency, unspecified: Secondary | ICD-10-CM | POA: Diagnosis not present

## 2018-02-22 ENCOUNTER — Other Ambulatory Visit: Payer: Self-pay | Admitting: Family Medicine

## 2018-02-22 DIAGNOSIS — R42 Dizziness and giddiness: Secondary | ICD-10-CM

## 2018-02-22 DIAGNOSIS — H7092 Unspecified mastoiditis, left ear: Secondary | ICD-10-CM

## 2018-02-22 DIAGNOSIS — R4789 Other speech disturbances: Secondary | ICD-10-CM

## 2018-02-27 ENCOUNTER — Ambulatory Visit
Admission: RE | Admit: 2018-02-27 | Discharge: 2018-02-27 | Disposition: A | Discharge status: Home / Self Care | Payer: BLUE CROSS/BLUE SHIELD | Source: Ambulatory Visit | Attending: Family Medicine | Admitting: Family Medicine

## 2018-02-27 DIAGNOSIS — H7092 Unspecified mastoiditis, left ear: Secondary | ICD-10-CM

## 2018-02-27 DIAGNOSIS — R42 Dizziness and giddiness: Secondary | ICD-10-CM | POA: Diagnosis not present

## 2018-02-27 DIAGNOSIS — R4789 Other speech disturbances: Secondary | ICD-10-CM

## 2018-02-27 DIAGNOSIS — R51 Headache: Secondary | ICD-10-CM | POA: Diagnosis not present

## 2018-05-14 DIAGNOSIS — Z01419 Encounter for gynecological examination (general) (routine) without abnormal findings: Secondary | ICD-10-CM | POA: Diagnosis not present

## 2018-05-30 DIAGNOSIS — H524 Presbyopia: Secondary | ICD-10-CM | POA: Diagnosis not present

## 2018-05-30 DIAGNOSIS — H2513 Age-related nuclear cataract, bilateral: Secondary | ICD-10-CM | POA: Diagnosis not present

## 2018-07-11 DIAGNOSIS — D045 Carcinoma in situ of skin of trunk: Secondary | ICD-10-CM | POA: Diagnosis not present

## 2018-07-11 DIAGNOSIS — D225 Melanocytic nevi of trunk: Secondary | ICD-10-CM | POA: Diagnosis not present

## 2018-07-11 DIAGNOSIS — D0472 Carcinoma in situ of skin of left lower limb, including hip: Secondary | ICD-10-CM | POA: Diagnosis not present

## 2018-07-11 DIAGNOSIS — D2261 Melanocytic nevi of right upper limb, including shoulder: Secondary | ICD-10-CM | POA: Diagnosis not present

## 2018-07-11 DIAGNOSIS — D0471 Carcinoma in situ of skin of right lower limb, including hip: Secondary | ICD-10-CM | POA: Diagnosis not present

## 2018-07-11 DIAGNOSIS — L821 Other seborrheic keratosis: Secondary | ICD-10-CM | POA: Diagnosis not present

## 2018-07-11 DIAGNOSIS — Z85828 Personal history of other malignant neoplasm of skin: Secondary | ICD-10-CM | POA: Diagnosis not present

## 2018-07-18 DIAGNOSIS — D045 Carcinoma in situ of skin of trunk: Secondary | ICD-10-CM | POA: Diagnosis not present

## 2018-10-22 ENCOUNTER — Telehealth: Payer: Self-pay | Admitting: Cardiology

## 2018-10-22 NOTE — Telephone Encounter (Signed)
Follow up  ° ° °Patient is returning call.  °

## 2018-10-22 NOTE — Telephone Encounter (Signed)
Left message to call back  

## 2018-10-22 NOTE — Telephone Encounter (Signed)
New message  [name] c/o Palpitations:  High priority if [name] c/o lightheadedness, shortness of breath, or chest pain  1) How long have you had palpitations/irregular HR/ Afib? Are you having the symptoms now? No,  About 2 months  2) Are you currently experiencing lightheadedness, SOB or CP?no   3) Do you have a history of afib (atrial fibrillation) or irregular heart rhythm? Yes   4) Have you checked your BP or HR? (document readings if available): 150/98 10/22/2018 65 HR  5) Are you experiencing any other symptoms? No

## 2018-10-22 NOTE — Telephone Encounter (Signed)
Spoke with pt who states over the weekend her palpitations has increased and she has noticed that her BP is running higher than normal. Today's BP 150/98. She reports over the weekend it was around the same. Pt scheduled an appointment to see Azalee Course, PA tomorrow 10/23/18 at 830. Will route to MD for any further recommendations.

## 2018-10-23 ENCOUNTER — Encounter: Payer: Self-pay | Admitting: Physician Assistant

## 2018-10-23 ENCOUNTER — Ambulatory Visit: Payer: BLUE CROSS/BLUE SHIELD | Admitting: Physician Assistant

## 2018-10-23 VITALS — BP 122/88 | HR 57 | Resp 16 | Ht 65.5 in | Wt 122.6 lb

## 2018-10-23 DIAGNOSIS — R002 Palpitations: Secondary | ICD-10-CM | POA: Diagnosis not present

## 2018-10-23 DIAGNOSIS — R072 Precordial pain: Secondary | ICD-10-CM

## 2018-10-23 DIAGNOSIS — I712 Thoracic aortic aneurysm, without rupture, unspecified: Secondary | ICD-10-CM

## 2018-10-23 DIAGNOSIS — R001 Bradycardia, unspecified: Secondary | ICD-10-CM

## 2018-10-23 LAB — COMPREHENSIVE METABOLIC PANEL
ALT: 20 IU/L (ref 0–32)
AST: 27 IU/L (ref 0–40)
Albumin/Globulin Ratio: 2.9 — ABNORMAL HIGH (ref 1.2–2.2)
Albumin: 5.2 g/dL — ABNORMAL HIGH (ref 3.6–4.8)
Alkaline Phosphatase: 62 IU/L (ref 39–117)
BUN/Creatinine Ratio: 19 (ref 12–28)
BUN: 14 mg/dL (ref 8–27)
Bilirubin Total: 0.6 mg/dL (ref 0.0–1.2)
CO2: 23 mmol/L (ref 20–29)
Calcium: 9.9 mg/dL (ref 8.7–10.3)
Chloride: 102 mmol/L (ref 96–106)
Creatinine, Ser: 0.74 mg/dL (ref 0.57–1.00)
GFR calc Af Amer: 101 mL/min/{1.73_m2} (ref >59)
GFR calc non Af Amer: 88 mL/min/{1.73_m2} (ref >59)
Globulin, Total: 1.8 g/dL (ref 1.5–4.5)
Glucose: 81 mg/dL (ref 65–99)
Potassium: 3.8 mmol/L (ref 3.5–5.2)
Sodium: 142 mmol/L (ref 134–144)
Total Protein: 7 g/dL (ref 6.0–8.5)

## 2018-10-23 LAB — CBC
Hematocrit: 41.8 % (ref 34.0–46.6)
Hemoglobin: 14.3 g/dL (ref 11.1–15.9)
MCH: 32.1 pg (ref 26.6–33.0)
MCHC: 34.2 g/dL (ref 31.5–35.7)
MCV: 94 fL (ref 79–97)
Platelets: 305 10*3/uL (ref 150–450)
RBC: 4.45 x10E6/uL (ref 3.77–5.28)
RDW: 12.8 % (ref 12.3–15.4)
WBC: 6.4 10*3/uL (ref 3.4–10.8)

## 2018-10-23 NOTE — Progress Notes (Signed)
Cardiology Office Note    Date:  10/23/2018   ID:  [name], DOB 12/28/1955, MRN [id number]  PCP:  Daisy Floro, MD  Cardiologist:  Dr. Antoine Poche   Chief Complaint  Patient presents with  . Follow-up    seen for Dr. Antoine Poche. Palpitation and atypical chest pain    History of Present Illness:  [name] is a 62 y.o. female with PMH of coronary calcium found incidentally on CT of her abdomen, aortic root enlargement, relative bradycardia and history of palpitation.  She was last seen by Dr. Antoine Poche in March 2017.  She had a CT cardiac score in August 2015 that showed calcium score of 100 which placed her at 109 percentile for age and sex matched control, she also had mildly dilated aortic root measuring 42 mm.  She also had a cardiac catheterization in 2015 that demonstrated no coronary artery disease but there was some calcification proximally in the LAD.  Echocardiogram obtained on 07/13/2014 showed EF 55 to 60%, the echocardiogram did not demonstrate any aortic root enlargement.  She also has a history of palpitation.  Dr. Antoine Poche recommended cutting back on her caffeine.  And if her symptom does not improve we will consider Holter monitor.  She was given a limited dose of Xanax during the last office visit to help with palpitation at night and to sleep.  Because she has resting bradycardia with heart rate of 48 at the time, she was not placed on beta-blocker.  Patient presents today for complaint of both palpitation and chest pain.  Her chest pain is very atypical in the described as a sharp shooting pain on the left side of the chest and also the back.  It occurs multiple times throughout the day.  She first noticed it yesterday.  It does not worsen with deep inspiration, body rotational palpation.  She has been feeling weak and has dizziness as well.  I will obtain a CMP and CBC as initial work-up along with a treadmill stress test.  If her symptoms persist, I would recommend a  coronary CT later.  As for her palpitation, she says she has been having some palpitation for the past 2 years, however this past weekend her palpitation has significantly increased.  She has experienced palpitation several times a day and also occasionally can go for more than 24 hours without palpitation.  I recommend a 48-hour Holter monitor to assess.  I will bring her back in 3 to 4 weeks to review the results.  She is aware that if her chest pain worsens or become more persistent she will need to seek medical attention in the emergency room.  I will however order CT angiogram of chest to follow-up on the size of the aorta.   Past Medical History:  Diagnosis Date  . Thyroid disease     Past Surgical History:  Procedure Laterality Date  . AIKEN OSTEOTOMY    . APPENDECTOMY    . CORONARY ANGIOGRAM  07/22/14   normal cors  . HAMMER TOE SURGERY    . LAPAROSCOPIC OOPHERECTOMY    . LEFT HEART CATHETERIZATION WITH CORONARY ANGIOGRAM N/A 07/22/2014   Procedure: LEFT HEART CATHETERIZATION WITH CORONARY ANGIOGRAM;  Surgeon: Lennette Bihari, MD;  Location: [facility] CATH LAB;  Service: Cardiovascular;  Laterality: N/A;  . METATARSAL OSTEOTOMY WITH BUNIONECTOMY    . NASAL SINUS SURGERY    . NEURECTOMY FOOT    . TUBAL LIGATION    . TUBAL LIGATION  Current Medications: Outpatient Medications Prior to Visit  Medication Sig Dispense Refill  . Cholecalciferol (VITAMIN D3) 125 MCG (5000 UT) CAPS Take 1 capsule by mouth daily.    Marland Kitchen levothyroxine (SYNTHROID, LEVOTHROID) 50 MCG tablet Take 50 mcg by mouth daily before breakfast.    . meclizine (ANTIVERT) 25 MG tablet Take 1 tablet (25 mg total) by mouth 3 (three) times daily as needed for dizziness. 10 tablet 0  . Multiple Vitamin (MULTIVITAMIN WITH MINERALS) TABS tablet Take 1 tablet by mouth daily.    . Omega-3 Fatty Acids (FISH OIL) 1000 MG CAPS Take 1,000 mg by mouth daily.    . traMADol (ULTRAM) 50 MG tablet Take 1 tablet (50 mg total) by mouth 3  (three) times daily. 90 tablet 2   No facility-administered medications prior to visit.      Allergies:   Sulfa antibiotics and Tetanus toxoids   Social History   Socioeconomic History  . Marital status: Married    Spouse name: Not on file  . Number of children: 2  . Years of education: Not on file  . Highest education level: Not on file  Occupational History  . Not on file  Social Needs  . Financial resource strain: Not on file  . Food insecurity:    Worry: Not on file    Inability: Not on file  . Transportation needs:    Medical: Not on file    Non-medical: Not on file  Tobacco Use  . Smoking status: Never Smoker  . Smokeless tobacco: Never Used  Substance and Sexual Activity  . Alcohol use: Yes    Comment: occ  . Drug use: No  . Sexual activity: Not on file  Lifestyle  . Physical activity:    Days per week: Not on file    Minutes per session: Not on file  . Stress: Not on file  Relationships  . Social connections:    Talks on phone: Not on file    Gets together: Not on file    Attends religious service: Not on file    Active member of club or organization: Not on file    Attends meetings of clubs or organizations: Not on file    Relationship status: Not on file  Other Topics Concern  . Not on file  Social History Narrative   Lives with husband.       Family History:  The patient's family history includes Alzheimer's disease in her mother; Cancer in her father; Congenital heart disease in her son; Heart disease in her father.   ROS:   Please see the history of present illness.    ROS All other systems reviewed and are negative.   PHYSICAL EXAM:   VS:  BP 122/88   Pulse (!) 57   Resp 16   Ht 5' 5.5" (1.664 m)   Wt 122 lb 9.6 oz (55.6 kg)   SpO2 97%   BMI 20.09 kg/m    GEN: Well nourished, well developed, in no acute distress  HEENT: normal  Neck: no JVD, carotid bruits, or masses Cardiac: RRR; no murmurs, rubs, or gallops,no edema  Respiratory:   clear to auscultation bilaterally, normal work of breathing GI: soft, nontender, nondistended, + BS MS: no deformity or atrophy  Skin: warm and dry, no rash Neuro:  Alert and Oriented x 3, Strength and sensation are intact Psych: euthymic mood, full affect  Wt Readings from Last 3 Encounters:  10/23/18 122 lb 9.6 oz (55.6 kg)  08/28/17 119 lb (54 kg)  02/25/16 123 lb 6.4 oz (56 kg)      Studies/Labs Reviewed:   EKG:  EKG is ordered today.  The ekg ordered today demonstrates sinus bradycardia, nonspecific T wave changes   Recent Labs: No results found for requested labs within last 8760 hours.   Lipid Panel    Component Value Date/Time   CHOL 223 (H) 07/21/2014 1006   TRIG 104 07/21/2014 1006   HDL 71 07/21/2014 1006   CHOLHDL 3.1 07/21/2014 1006   VLDL 21 07/21/2014 1006   LDLCALC 131 (H) 07/21/2014 1006    Additional studies/ records that were reviewed today include:   Echo 07/14/2014 LV EF: 55% -  60% Study Conclusions  - Left ventricle: The cavity size was normal. Systolic function was normal. The estimated ejection fraction was in the range of 55% to 60%. Wall motion was normal; there were no regional wall motion abnormalities. Left ventricular diastolic function parameters were normal.    ASSESSMENT:    1. Palpitations   2. Precordial pain   3. Thoracic aortic aneurysm without rupture (HCC)   4. Bradycardia      PLAN:  In order of problems listed above:  1. Palpitation: She has a long-standing history of palpitation.  This has not improved with reduce caffeine intake.  Palpitation is worsening in the past few days.  I will obtain a 48-hour Holter monitor.  It would be problematic to treat her palpitations since she has relative bradycardia.    2. Precordial chest pain: This appears to be very typical and only last a second at the time.  However this occurred multiple times throughout the day since yesterday.  It is not worse with deep  inspiration, body rotation or palpation.  I recommended a treadmill stress test.  3. Thoracic aortic aneurysm: She had a previous CT that showed her thoracic aorta measured 4.2 cm.  This has never followed up since 2015.  I will repeat a CT angiogram of the chest.  4. Relative bradycardia: She is quite active and that she is not on any AV nodal blocking agent.    Medication Adjustments/Labs and Tests Ordered: Current medicines are reviewed at length with the patient today.  Concerns regarding medicines are outlined above.  Medication changes, Labs and Tests ordered today are listed in the Patient Instructions below. Patient Instructions  Medication Instructions:  No Changes  If you need a refill on your cardiac medications before your next appointment, please call your pharmacy.   Lab work: Your physician recommends that you return for lab work today: CMET, CBC  If you have labs (blood work) drawn today and your tests are completely normal, you will receive your results only by: Marland Kitchen MyChart Message (if you have MyChart) OR . A paper copy in the mail If you have any lab test that is abnormal or we need to change your treatment, we will call you to review the results.  Testing/Procedures: Your physician has requested that you have an exercise tolerance test (STAT). For further information please visit [email or web address] Please also follow instruction sheet, as given.  Your physician has recommended that you wear a 48 hour holter monitor (ASAP). Holter monitors are medical devices that record the heart's electrical activity. Doctors most often use these monitors to diagnose arrhythmias. Arrhythmias are problems with the speed or rhythm of the heartbeat. The monitor is a small, portable device. You can wear one while you do your normal daily activities. This is  usually used to diagnose what is causing palpitations/syncope (passing out).  Your physician has recommended that you schedule a CT  Angio Chest/ Aorta.   Follow-Up: At [facility], you and your health needs are our priority.  As part of our continuing mission to provide you with exceptional heart care, we have created designated Provider Care Teams.  These Care Teams include your primary Cardiologist (physician) and Advanced Practice Providers (APPs -  Physician Assistants and Nurse Practitioners) who all work together to provide you with the care you need, when you need it.  Please schedule a follow up appointment with Azalee CourseHao Anwar Sakata, PA in 2-3 weeks.   Any Other Special Instructions Will Be Listed Below (If Applicable).  If chest pain worsens, please go to the Emergency room.      Ramond DialSigned, Oretha Weismann, [place]PA  10/23/2018 10:06 AM    [facility] [address], [place], [place]  [address] Phone: [phone]) [phone]; Fax: [phone]) [phone]

## 2018-10-23 NOTE — Patient Instructions (Signed)
Medication Instructions:  No Changes  If you need a refill on your cardiac medications before your next appointment, please call your pharmacy.   Lab work: Your physician recommends that you return for lab work today: CMET, CBC  If you have labs (blood work) drawn today and your tests are completely normal, you will receive your results only by: Marland Kitchen. MyChart Message (if you have MyChart) OR . A paper copy in the mail If you have any lab test that is abnormal or we need to change your treatment, we will call you to review the results.  Testing/Procedures: Your physician has requested that you have an exercise tolerance test (STAT). For further information please visit [email or web address] Please also follow instruction sheet, as given.  Your physician has recommended that you wear a 48 hour holter monitor (ASAP). Holter monitors are medical devices that record the heart's electrical activity. Doctors most often use these monitors to diagnose arrhythmias. Arrhythmias are problems with the speed or rhythm of the heartbeat. The monitor is a small, portable device. You can wear one while you do your normal daily activities. This is usually used to diagnose what is causing palpitations/syncope (passing out).  Your physician has recommended that you schedule a CT Angio Chest/ Aorta.   Follow-Up: At [facility], you and your health needs are our priority.  As part of our continuing mission to provide you with exceptional heart care, we have created designated Provider Care Teams.  These Care Teams include your primary Cardiologist (physician) and Advanced Practice Providers (APPs -  Physician Assistants and Nurse Practitioners) who all work together to provide you with the care you need, when you need it.  Please schedule a follow up appointment with Jane CourseHao Meng, PA in 2-3 weeks.   Any Other Special Instructions Will Be Listed Below (If Applicable).  If chest pain worsens, please go to the  Emergency room.

## 2018-10-23 NOTE — Telephone Encounter (Signed)
Pt saw Azalee CourseHao Meng in office today 11/12

## 2018-10-25 NOTE — Progress Notes (Signed)
CBC normal, kidney function and electrolyte ok .

## 2018-10-26 ENCOUNTER — Ambulatory Visit (INDEPENDENT_AMBULATORY_CARE_PROVIDER_SITE_OTHER): Payer: BLUE CROSS/BLUE SHIELD

## 2018-10-26 DIAGNOSIS — R002 Palpitations: Secondary | ICD-10-CM | POA: Diagnosis not present

## 2018-10-30 ENCOUNTER — Telehealth (HOSPITAL_COMMUNITY): Payer: Self-pay

## 2018-10-30 NOTE — Telephone Encounter (Signed)
Encounter complete. 

## 2018-10-31 ENCOUNTER — Encounter (HOSPITAL_COMMUNITY): Payer: Self-pay

## 2018-10-31 ENCOUNTER — Other Ambulatory Visit: Payer: Self-pay

## 2018-10-31 ENCOUNTER — Emergency Department (HOSPITAL_COMMUNITY)
Admission: EM | Admit: 2018-10-31 | Discharge: 2018-10-31 | Disposition: A | Discharge status: Home / Self Care | Payer: BLUE CROSS/BLUE SHIELD | Attending: Emergency Medicine | Admitting: Emergency Medicine

## 2018-10-31 ENCOUNTER — Emergency Department (HOSPITAL_COMMUNITY): Payer: BLUE CROSS/BLUE SHIELD

## 2018-10-31 DIAGNOSIS — R202 Paresthesia of skin: Secondary | ICD-10-CM | POA: Insufficient documentation

## 2018-10-31 DIAGNOSIS — R42 Dizziness and giddiness: Secondary | ICD-10-CM | POA: Diagnosis not present

## 2018-10-31 DIAGNOSIS — R072 Precordial pain: Secondary | ICD-10-CM | POA: Diagnosis not present

## 2018-10-31 DIAGNOSIS — R0602 Shortness of breath: Secondary | ICD-10-CM | POA: Diagnosis not present

## 2018-10-31 DIAGNOSIS — R079 Chest pain, unspecified: Secondary | ICD-10-CM | POA: Diagnosis present

## 2018-10-31 DIAGNOSIS — Z79899 Other long term (current) drug therapy: Secondary | ICD-10-CM | POA: Diagnosis not present

## 2018-10-31 DIAGNOSIS — I714 Abdominal aortic aneurysm, without rupture: Secondary | ICD-10-CM | POA: Diagnosis not present

## 2018-10-31 DIAGNOSIS — R109 Unspecified abdominal pain: Secondary | ICD-10-CM | POA: Diagnosis not present

## 2018-10-31 LAB — COMPREHENSIVE METABOLIC PANEL
ALT: 23 U/L (ref 0–44)
AST: 32 U/L (ref 15–41)
Albumin: 4.5 g/dL (ref 3.5–5.0)
Alkaline Phosphatase: 54 U/L (ref 38–126)
Anion gap: 9 (ref 5–15)
BUN: 14 mg/dL (ref 8–23)
CO2: 25 mmol/L (ref 22–32)
Calcium: 9.5 mg/dL (ref 8.9–10.3)
Chloride: 103 mmol/L (ref 98–111)
Creatinine, Ser: 0.71 mg/dL (ref 0.44–1.00)
GFR calc Af Amer: >60 mL/min (ref >60)
GFR calc non Af Amer: >60 mL/min (ref >60)
Glucose, Bld: 87 mg/dL (ref 70–99)
Potassium: 3.7 mmol/L (ref 3.5–5.1)
Sodium: 137 mmol/L (ref 135–145)
Total Bilirubin: 1 mg/dL (ref 0.3–1.2)
Total Protein: 7.4 g/dL (ref 6.5–8.1)

## 2018-10-31 LAB — URINALYSIS, ROUTINE W REFLEX MICROSCOPIC
Bacteria, UA: NONE SEEN
Bilirubin Urine: NEGATIVE
Glucose, UA: NEGATIVE mg/dL
Ketones, ur: 5 mg/dL — AB
Leukocytes, UA: NEGATIVE
Nitrite: NEGATIVE
Protein, ur: NEGATIVE mg/dL
Specific Gravity, Urine: 1.004 — ABNORMAL LOW (ref 1.005–1.030)
pH: 5 (ref 5.0–8.0)

## 2018-10-31 LAB — I-STAT TROPONIN, ED: Troponin i, poc: 0.01 ng/mL (ref 0.00–0.08)

## 2018-10-31 LAB — CBC WITH DIFFERENTIAL/PLATELET
Abs Immature Granulocytes: 0 10*3/uL (ref 0.00–0.07)
Basophils Absolute: 0 10*3/uL (ref 0.0–0.1)
Basophils Relative: 1 %
Eosinophils Absolute: 0.1 10*3/uL (ref 0.0–0.5)
Eosinophils Relative: 1 %
HCT: 42.9 % (ref 36.0–46.0)
Hemoglobin: 14.2 g/dL (ref 12.0–15.0)
Immature Granulocytes: 0 %
Lymphocytes Relative: 49 %
Lymphs Abs: 2.5 10*3/uL (ref 0.7–4.0)
MCH: 31.6 pg (ref 26.0–34.0)
MCHC: 33.1 g/dL (ref 30.0–36.0)
MCV: 95.3 fL (ref 80.0–100.0)
Monocytes Absolute: 0.5 10*3/uL (ref 0.1–1.0)
Monocytes Relative: 9 %
Neutro Abs: 2.1 10*3/uL (ref 1.7–7.7)
Neutrophils Relative %: 40 %
Platelets: 267 10*3/uL (ref 150–400)
RBC: 4.5 MIL/uL (ref 3.87–5.11)
RDW: 11.9 % (ref 11.5–15.5)
WBC: 5.1 10*3/uL (ref 4.0–10.5)
nRBC: 0 % (ref 0.0–0.2)

## 2018-10-31 LAB — RAPID URINE DRUG SCREEN, HOSP PERFORMED
Amphetamines: NOT DETECTED
Barbiturates: NOT DETECTED
Benzodiazepines: POSITIVE — AB
Cocaine: NOT DETECTED
Opiates: NOT DETECTED
Tetrahydrocannabinol: NOT DETECTED

## 2018-10-31 LAB — PROTIME-INR
INR: 1.09
Prothrombin Time: 14 seconds (ref 11.4–15.2)

## 2018-10-31 LAB — LIPASE, BLOOD: Lipase: 65 U/L — ABNORMAL HIGH (ref 11–51)

## 2018-10-31 LAB — BRAIN NATRIURETIC PEPTIDE: B Natriuretic Peptide: 13.5 pg/mL (ref 0.0–100.0)

## 2018-10-31 MED ORDER — IOPAMIDOL (ISOVUE-370) INJECTION 76%
INTRAVENOUS | Status: AC
  Filled 2018-10-31: qty 100

## 2018-10-31 MED ORDER — IOPAMIDOL (ISOVUE-370) INJECTION 76%
100.0000 mL | Freq: Once | INTRAVENOUS | Status: AC | PRN
  Administered 2018-10-31: 100 mL via INTRAVENOUS

## 2018-10-31 MED ORDER — OMEPRAZOLE 20 MG PO CPDR: 20.0000 mg | DELAYED_RELEASE_CAPSULE | Freq: Every day | ORAL | 0 refills | Status: DC

## 2018-10-31 NOTE — ED Notes (Signed)
Pt returned from CT °

## 2018-10-31 NOTE — ED Notes (Signed)
ED Provider at bedside. 

## 2018-10-31 NOTE — ED Triage Notes (Signed)
Pt states that she has been having discomfort in her chest is supposed to have cardiac testing tomorrow and Friday. Has been having CP and palliations over the last few weeks, reports that she has also been under a lot of stress.

## 2018-10-31 NOTE — ED Provider Notes (Signed)
[facility] EMERGENCY DEPARTMENT Provider Note   CSN: [id number] Arrival date & time: 10/31/18  4098     History   Chief Complaint Chief Complaint  Patient presents with  . Chest Pain    HPI [name] is a 62 y.o. female.  HPI Patient reports that she was diagnosed with a small thoracic aneurysm 4 years ago.  She reports that initially it was missed.  She denies she has had any diagnostic study specifically for that since 4 years ago.  Patient reports that for several weeks now she has been getting central\left-sided chest pain with radiation to the left shoulder.  She gets shortness of breath in association with these episodes.  Patient does had a cardiology follow-up appointment earlier in the week.  She reports that she is scheduled to get a stress test tomorrow and a CT scan in the next couple of days.  She reports the symptoms got worse this morning.  She reports severe pain that is sharp in her left anterior chest rating to the shoulder.  She reports also some tingling sensation in the left hand.  It has abated some since her arrival to the emergency department.  No lower extremity symptoms no calf pain.  No syncope.  Patient reports general lightheadedness without vertiginous symptoms.  Patient denies a history of hypertension.  She reports generally her heart rate and blood pressure run low but today when she measured her pressure she reports that her systolic pressure was at 200 when she was having pain. Past Medical History:  Diagnosis Date  . Thyroid disease     Patient Active Problem List   Diagnosis Date Noted  . Abnormal cardiac findings on abd CT- (cardiomegaly) May 2015 08/12/2014  . Cardiomegaly 07/10/2014  . Precordial chest pain 07/10/2014  . Tarsal tunnel syndrome 05/23/2012  . Bunion of great toe of right foot 05/23/2012    Past Surgical History:  Procedure Laterality Date  . AIKEN OSTEOTOMY    . APPENDECTOMY    . CORONARY ANGIOGRAM   07/22/14   normal cors  . HAMMER TOE SURGERY    . LAPAROSCOPIC OOPHERECTOMY    . LEFT HEART CATHETERIZATION WITH CORONARY ANGIOGRAM N/A 07/22/2014   Procedure: LEFT HEART CATHETERIZATION WITH CORONARY ANGIOGRAM;  Surgeon: Lennette Bihari, MD;  Location: [facility] CATH LAB;  Service: Cardiovascular;  Laterality: N/A;  . METATARSAL OSTEOTOMY WITH BUNIONECTOMY    . NASAL SINUS SURGERY    . NEURECTOMY FOOT    . TUBAL LIGATION    . TUBAL LIGATION       OB History   None      Home Medications    Prior to Admission medications   Medication Sig Start Date End Date Taking? Authorizing Provider  Cholecalciferol (VITAMIN D3) 125 MCG (5000 UT) CAPS Take 1 capsule by mouth daily.   Yes [provider]  diazepam (VALIUM) 5 MG tablet Take 5 mg by mouth every 6 (six) hours as needed (vertigo).   Yes [provider]  levothyroxine (SYNTHROID, LEVOTHROID) 50 MCG tablet Take 50 mcg by mouth daily before breakfast.   Yes [provider]  naproxen sodium (ALEVE) 220 MG tablet Take 440 mg by mouth as needed (pain).   Yes [provider]  omeprazole (PRILOSEC) 20 MG capsule Take 1 capsule (20 mg total) by mouth daily. 10/31/18   Arby Barrette, MD    Family History Family History  Problem Relation Age of Onset  . Cancer Father   .  Heart disease Father        Pacemaker  . Alzheimer's disease Mother   . Congenital heart disease Son        Coarc    Social History Social History   Tobacco Use  . Smoking status: Never Smoker  . Smokeless tobacco: Never Used  Substance Use Topics  . Alcohol use: Yes    Comment: occ  . Drug use: No     Allergies   Sulfa antibiotics and Tetanus toxoids   Review of Systems Review of Systems 10 Systems reviewed and are negative for acute change except as noted in the HPI.   Physical Exam Updated Vital Signs BP 120/87   Pulse (!) 31   Resp 16   Ht 5\' 5"  (1.651 m)   Wt 55.3 kg   SpO2 94%   BMI 20.30 kg/m   Physical  Exam  Constitutional: She is oriented to person, place, and time.  Patient is alert and nontoxic.  Clinically well appearance.  No respiratory distress.  HENT:  Head: Normocephalic and atraumatic.  Nose: Nose normal.  Mouth/Throat: Oropharynx is clear and moist.  Eyes: Pupils are equal, round, and reactive to light. EOM are normal.  Neck: Neck supple.  No bruit.  Neck is supple.  No anterior soft tissue pain to palpation.  Cardiovascular: Normal rate, regular rhythm, normal heart sounds and intact distal pulses.  Bilateral radial pulses 2+ symmetric.  Pulmonary/Chest: Effort normal and breath sounds normal. She exhibits no tenderness.  Abdominal: Soft. She exhibits no distension and no mass. There is no tenderness. There is no guarding.  Musculoskeletal: Normal range of motion. She exhibits no edema or tenderness.  Lower extremities normal without peripheral edema or calf tenderness.  Dorsalis pedis pulses 2+ and symmetric.  Neurological: She is alert and oriented to person, place, and time. No cranial nerve deficit. She exhibits normal muscle tone. Coordination normal.  Skin: Skin is warm and dry.  Psychiatric:  Patient is anxious in appearance.  Mental status is normal.  Normal interaction and behaviors.     ED Treatments / Results  Labs (all labs ordered are listed, but only abnormal results are displayed) Labs Reviewed  LIPASE, BLOOD - Abnormal; Notable for the following components:      Result Value   Lipase 65 (*)    All other components within normal limits  URINALYSIS, ROUTINE W REFLEX MICROSCOPIC - Abnormal; Notable for the following components:   Color, Urine STRAW (*)    Specific Gravity, Urine 1.004 (*)    Hgb urine dipstick SMALL (*)    Ketones, ur 5 (*)    All other components within normal limits  RAPID URINE DRUG SCREEN, HOSP PERFORMED - Abnormal; Notable for the following components:   Benzodiazepines POSITIVE (*)    All other components within normal limits    COMPREHENSIVE METABOLIC PANEL  BRAIN NATRIURETIC PEPTIDE  CBC WITH DIFFERENTIAL/PLATELET  PROTIME-INR  I-STAT TROPONIN, ED    EKG EKG Interpretation  Date/Time:  Wednesday October 31 2018 08:28:11 EST Ventricular Rate:  61 PR Interval:    QRS Duration: 99 QT Interval:  357 QTC Calculation: 360 R Axis:   51 Text Interpretation:  Sinus rhythm Probable anteroseptal infarct, old Nonspecific T abnormalities, lateral leads Baseline wander in lead(s) V5 baseline artifact, otherwise no sig change from previous Confirmed by Arby Barrette [id number]) on 10/31/2018 8:31:55 AM   Radiology Ct Angio Chest/abd/pel For Dissection W And/or W/wo  Result Date: 10/31/2018 CLINICAL DATA:  62 year old female  with acute chest, back and abdominal pain. EXAM: CT ANGIOGRAPHY CHEST, ABDOMEN AND PELVIS TECHNIQUE: Multidetector CT imaging through the chest, abdomen and pelvis was performed using the standard protocol during bolus administration of intravenous contrast. Multiplanar reconstructed images and MIPs were obtained and reviewed to evaluate the vascular anatomy. CONTRAST:  ISOVUE-370 IOPAMIDOL (ISOVUE-370) INJECTION 76% COMPARISON:  07/14/2014 calcium scoring CT, 09/08/2017 abdomen/pelvis CT and other studies. FINDINGS: CTA CHEST FINDINGS Cardiovascular: This is a technically satisfactory study. There is no evidence of thoracic aortic dissection. Fusiform aneurysm of the ascending aorta measures up to 4.1 cm in diameter. Cardiomegaly and LAD coronary calcifications noted. No large or central pulmonary emboli are identified. No pericardial effusion. Mediastinum/Nodes: No enlarged mediastinal, hilar, or axillary lymph nodes. Thyroid gland, trachea, and esophagus demonstrate no significant findings. Lungs/Pleura: Minimal bibasilar scarring again noted. No pulmonary nodule, mass, airspace disease, consolidation, pleural effusion or pneumothorax. Musculoskeletal: No chest wall abnormality. No acute or  significant osseous findings. Review of the MIP images confirms the above findings. CTA ABDOMEN AND PELVIS FINDINGS VASCULAR The abdominal aorta is unremarkable. There is no evidence of abdominal aortic aneurysm or dissection. No vascular abnormalities are identified. Review of the MIP images confirms the above findings. NON-VASCULAR Hepatobiliary: The liver and gallbladder are unremarkable. No biliary dilatation. Pancreas: Unremarkable Spleen: Unremarkable Adrenals/Urinary Tract: The kidneys, adrenal glands and bladder are unremarkable. Stomach/Bowel: Stomach is within normal limits. No evidence of bowel wall thickening, distention, or inflammatory changes. Lymphatic: No abnormal lymph nodes identified. Reproductive: Uterus and bilateral adnexa are unremarkable. Other: No free fluid, focal collection, pneumoperitoneum or abdominal wall hernia. Musculoskeletal: No acute or suspicious bony abnormalities are identified. Review of the MIP images confirms the above findings. IMPRESSION: 1. No evidence of acute abnormality. No evidence of aortic dissection. 2. 4.1 cm fusiform aneurysm of the ascending thoracic aorta. Recommend annual imaging followup by CTA or MRA. This recommendation follows 2010 ACCF/AHA/AATS/ACR/ASA/SCA/SCAI/SIR/STS/SVM Guidelines for the Diagnosis and Management of Patients with Thoracic Aortic Disease. Circulation. 2010; 121: Z610-R604 3. Cardiomegaly and coronary artery disease. Electronically Signed   By: Harmon Pier M.D.   On: 10/31/2018 10:47    Procedures Procedures (including critical care time)  Medications Ordered in ED Medications  iopamidol (ISOVUE-370) 76 % injection (has no administration in time range)  iopamidol (ISOVUE-370) 76 % injection 100 mL (100 mLs Intravenous Contrast Given 10/31/18 0952)     Initial Impression / Assessment and Plan / ED Course  I have reviewed the triage vital signs and the nursing notes.  Pertinent labs & imaging results that were available  during my care of the patient were reviewed by me and considered in my medical decision making (see chart for details).     Patient presents with complaint of chest pain and lightheadedness.  For several weeks now the symptoms have been coming and going.  Patient reports she does have a known abdominal aneurysm.  She is scheduled for diagnostic evaluation over the next several days.  She reports that the pain episode got severe this morning.  She does identify that she is also under a lot of stress.  CT scan obtained to rule out aneurysm or dissection.  Patient has a stable 4 cm aneurysm with no evidence of dissection or other immediate pathology.  Patient's blood pressures are normal.  No evidence of hypertension.  Her heart rate is in the 60s.  I do not suspect hypertensive emergency.  Patient states that when she does get symptoms her blood pressure sometimes goes  very high.  At this time however with blood pressure within the normal to low range, I feel that she will become hypotensive with any additional blood pressure medications.  I doubt PE.  Patient does not have tachycardia, hypoxia, tachypnea or lower extremity symptoms.  After evaluation, patient is symptom-free and with normal vital signs.  At this time I believe she is stable to continue her cardiac stress test tomorrow as scheduled and continue to follow-up with cardiology and her PCP.  Return precautions reviewed.  Final Clinical Impressions(s) / ED Diagnoses   Final diagnoses:  Precordial pain  Lightheaded    ED Discharge Orders         Ordered    omeprazole (PRILOSEC) 20 MG capsule  Daily     10/31/18 1205           Arby BarrettePfeiffer, Audrea Bolte, MD 10/31/18 1208

## 2018-10-31 NOTE — ED Notes (Signed)
Pt verbalized understanding regarding discharge. Denies needs or concerns at this time. No further questions regarding discharge or AVS

## 2018-10-31 NOTE — ED Notes (Signed)
Patient transported to CT 

## 2018-10-31 NOTE — Discharge Instructions (Signed)
At this time your aneurysm is stable. It will require regular surveillance. Get your Stress Test tomorrow as planned. Start a daily Prilosec. Return to the ER if you have new, worsening or concerning symptoms.

## 2018-11-01 ENCOUNTER — Ambulatory Visit (HOSPITAL_COMMUNITY)
Admission: RE | Admit: 2018-11-01 | Discharge: 2018-11-01 | Disposition: A | Discharge status: Home / Self Care | Payer: BLUE CROSS/BLUE SHIELD | Source: Ambulatory Visit | Attending: Cardiovascular Disease | Admitting: Cardiovascular Disease

## 2018-11-01 DIAGNOSIS — R072 Precordial pain: Secondary | ICD-10-CM

## 2018-11-01 LAB — EXERCISE TOLERANCE TEST
Estimated workload: 16.7 METS
Exercise duration (min): 13 min
Exercise duration (sec): 46 s
MPHR: 159 {beats}/min
Peak HR: 166 {beats}/min
Percent HR: 104 %
RPE: 15
Rest HR: 56 {beats}/min

## 2018-11-02 ENCOUNTER — Inpatient Hospital Stay: Admission: RE | Admit: 2018-11-02 | Payer: BLUE CROSS/BLUE SHIELD | Source: Ambulatory Visit

## 2018-11-06 ENCOUNTER — Encounter: Payer: Self-pay | Admitting: Physician Assistant

## 2018-11-06 ENCOUNTER — Ambulatory Visit: Payer: BLUE CROSS/BLUE SHIELD | Admitting: Physician Assistant

## 2018-11-06 VITALS — BP 110/70 | HR 56 | Ht 65.0 in | Wt 126.2 lb

## 2018-11-06 DIAGNOSIS — I2510 Atherosclerotic heart disease of native coronary artery without angina pectoris: Secondary | ICD-10-CM

## 2018-11-06 DIAGNOSIS — R001 Bradycardia, unspecified: Secondary | ICD-10-CM | POA: Diagnosis not present

## 2018-11-06 DIAGNOSIS — I712 Thoracic aortic aneurysm, without rupture: Secondary | ICD-10-CM

## 2018-11-06 DIAGNOSIS — I7121 Aneurysm of the ascending aorta, without rupture: Secondary | ICD-10-CM

## 2018-11-06 DIAGNOSIS — R002 Palpitations: Secondary | ICD-10-CM

## 2018-11-06 DIAGNOSIS — I2584 Coronary atherosclerosis due to calcified coronary lesion: Secondary | ICD-10-CM

## 2018-11-06 DIAGNOSIS — E039 Hypothyroidism, unspecified: Secondary | ICD-10-CM

## 2018-11-06 DIAGNOSIS — R0789 Other chest pain: Secondary | ICD-10-CM

## 2018-11-06 NOTE — Progress Notes (Signed)
Cardiology Office Note    Date:  11/06/2018   ID:  Jane, [name] [birth year], MRN [id number]  PCP:  Daisy Floro, MD  Cardiologist:  Dr. Antoine Poche   Chief Complaint  Patient presents with  . Follow-up    seen for Dr. Antoine Poche.     History of Present Illness:  [name] is a 62 y.o. female with PMH of coronary calcium found incidentally on CT of her abdomen, aortic root enlargement, relative bradycardia and history of palpitation.  She was last seen by Dr. Antoine Poche in March 2017.  She had a CT cardiac score in August 2015 that showed calcium score of 100 which placed her at 60 percentile for age and sex matched control, she also had mildly dilated aortic root measuring 42 mm.  She had a cardiac catheterization in 2015 that demonstrated no coronary artery disease but there was some calcification proximally in the LAD.  Echocardiogram obtained on 07/13/2014 showed EF 55 to 60%, the echocardiogram did not demonstrate any aortic root enlargement.  She also has a history of palpitation.  Dr. Antoine Poche recommended cutting back on her caffeine, and if her symptom does not improve we will consider Holter monitor.  She was given a limited dose of Xanax during the last office visit to help with palpitation at night and to sleep.  Because she has resting bradycardia with heart rate of 48 at the time, she was not placed on beta-blocker.  I last saw the patient on 10/23/2018, she complained of both chest pain and palpitation.  Her chest pain was very atypical.  I ordered both treadmill stress test and 48-hour Holter monitor.  Before she had the chance of completing her treadmill test, she presented to ED on 10/31/2018.  CT angiogram of the chest showed a stable 4.1 cm a sending thoracic aortic aneurysm.  Treadmill nuclear stress test was low risk as well, patient was able to achieve 16.7 METS without significant EKG abnormality.  48-hour Holter monitor showed 5 beats run of atrial tachycardia, rare  supraventricular beats, otherwise mainly normal sinus rhythm.  She recently self discontinued her Synthroid, she says she had very similar symptoms in the past when she is taking too much thyroid medication.  I will defer this management and monitoring to her primary care provider.  However she says since her ED visit, she has been doing extremely well.  She has went back to running which she used to love.  It is a great stress reliever.  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  She says her recent chest discomfort actually felt much better after running.  She has not had any more chest pain after she stopped Synthroid medication.  Otherwise she does not have any lower extremity edema, orthopnea or PND.   Past Medical History:  Diagnosis Date  . Thyroid disease     Past Surgical History:  Procedure Laterality Date  . AIKEN OSTEOTOMY    . APPENDECTOMY    . CORONARY ANGIOGRAM  07/22/14   normal cors  . HAMMER TOE SURGERY    . LAPAROSCOPIC OOPHERECTOMY    . LEFT HEART CATHETERIZATION WITH CORONARY ANGIOGRAM N/A 07/22/2014   Procedure: LEFT HEART CATHETERIZATION WITH CORONARY ANGIOGRAM;  Surgeon: Lennette Bihari, MD;  Location: [facility] CATH LAB;  Service: Cardiovascular;  Laterality: N/A;  . METATARSAL OSTEOTOMY WITH BUNIONECTOMY    . NASAL SINUS SURGERY    . NEURECTOMY FOOT    .  TUBAL LIGATION    . TUBAL LIGATION      Current Medications: Outpatient Medications Prior to Visit  Medication Sig Dispense Refill  . Cholecalciferol (VITAMIN D3) 125 MCG (5000 UT) CAPS Take 1 capsule by mouth daily.    . naproxen sodium (ALEVE) 220 MG tablet Take 440 mg by mouth as needed (pain).    Marland Kitchen omeprazole (PRILOSEC) 20 MG capsule Take 1 capsule (20 mg total) by mouth daily. 30 capsule 0  . diazepam (VALIUM) 5 MG tablet Take 5 mg by mouth every 6 (six) hours as needed (vertigo).    Marland Kitchen levothyroxine (SYNTHROID, LEVOTHROID) 50 MCG tablet Take 50 mcg by mouth daily before breakfast.      No facility-administered medications prior to visit.      Allergies:   Sulfa antibiotics and Tetanus toxoids   Social History   Socioeconomic History  . Marital status: Married    Spouse name: Not on file  . Number of children: 2  . Years of education: Not on file  . Highest education level: Not on file  Occupational History  . Not on file  Social Needs  . Financial resource strain: Not on file  . Food insecurity:    Worry: Not on file    Inability: Not on file  . Transportation needs:    Medical: Not on file    Non-medical: Not on file  Tobacco Use  . Smoking status: Never Smoker  . Smokeless tobacco: Never Used  Substance and Sexual Activity  . Alcohol use: Yes    Comment: occ  . Drug use: No  . Sexual activity: Not on file  Lifestyle  . Physical activity:    Days per week: Not on file    Minutes per session: Not on file  . Stress: Not on file  Relationships  . Social connections:    Talks on phone: Not on file    Gets together: Not on file    Attends religious service: Not on file    Active member of club or organization: Not on file    Attends meetings of clubs or organizations: Not on file    Relationship status: Not on file  Other Topics Concern  . Not on file  Social History Narrative   Lives with husband.       Family History:  The patient's family history includes Alzheimer's disease in her mother; Cancer in her father; Congenital heart disease in her son; Heart disease in her father.   ROS:   Please see the history of present illness.    ROS All other systems reviewed and are negative.   PHYSICAL EXAM:   VS:  BP 110/70   Pulse (!) 56   Ht 5\' 5"  (1.651 m)   Wt 126 lb 3.2 oz (57.2 kg)   SpO2 99%   BMI 21.00 kg/m    GEN: Well nourished, well developed, in no acute distress  HEENT: normal  Neck: no JVD, carotid bruits, or masses Cardiac: RRR; no murmurs, rubs, or gallops,no edema  Respiratory:  clear to auscultation bilaterally, normal  work of breathing GI: soft, nontender, nondistended, + BS MS: no deformity or atrophy  Skin: warm and dry, no rash Neuro:  Alert and Oriented x 3, Strength and sensation are intact Psych: euthymic mood, full affect  Wt Readings from Last 3 Encounters:  11/06/18 126 lb 3.2 oz (57.2 kg)  10/31/18 122 lb (55.3 kg)  10/23/18 122 lb 9.6 oz (55.6 kg)  Studies/Labs Reviewed:   EKG:  EKG is not ordered today.  Recent Labs: 10/31/2018: ALT 23; B Natriuretic Peptide 13.5; BUN 14; Creatinine, Ser 0.71; Hemoglobin 14.2; Platelets 267; Potassium 3.7; Sodium 137   Lipid Panel    Component Value Date/Time   CHOL 223 (H) 07/21/2014 1006   TRIG 104 07/21/2014 1006   HDL 71 07/21/2014 1006   CHOLHDL 3.1 07/21/2014 1006   VLDL 21 07/21/2014 1006   LDLCALC 131 (H) 07/21/2014 1006    Additional studies/ records that were reviewed today include:   Holter 10/26/2018 Study Highlights   NSR Rare supraventricular beats 5 beat runs of atrial tachycardia. Rare ventricular ectopy     ETT 11/01/2018 Study Highlights     Blood pressure demonstrated a normal response to exercise.   Normal exercise treadmill test. Excellent exercise capacity, achieving 16.7 METs. No significant ECG abormalities; two rare PVCs seen in early exercise. Duke treadmill score is 12, low risk.       ASSESSMENT:    1. Atypical chest pain   2. Coronary artery calcification   3. Ascending aortic aneurysm (HCC)   4. Bradycardia   5. Palpitation   6. Hypothyroidism, unspecified type      PLAN:  In order of problems listed above:  1. Atypical chest pain: She is able to complete a 16.7 METS of activity on the treadmill without any chest pain.  In fact when she runs, her chest pain feels better.  She has not had any further episode, no further work-up is recommended at this time.  During her recent ED visit, troponin was negative  2. Ascending aortic and aneurysm: Initially seen on coronary CT in 2015,  recent repeat CT angiogram of the chest reveals 4.1 cm a sending thoracic aortic aneurysm.  We will continue to follow-up on a yearly basis  3. History of bradycardia: She has asymptomatic relative bradycardia.  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 Palpitation: Recent 48-hour monitor does reveal occasional atrial tachycardia, symptoms improved after she stopped her Synthroid  5. Hypothyroidism: Patient self discontinued Synthroid due to fear of hyperthyroidism, this will need to be managed and monitored by primary care provider.    Medication Adjustments/Labs and Tests Ordered: Current medicines are reviewed at length with the patient today.  Concerns regarding medicines are outlined above.  Medication changes, Labs and Tests ordered today are listed in the Patient Instructions below. Patient Instructions  Medication Instructions:  Continue same medications If you need a refill on your cardiac medications before your next appointment, please call your pharmacy.   Lab work: None ordered   Testing/Procedures: None ordered  Follow-Up: At [facility], you and your health needs are our priority.  As part of our continuing mission to provide you with exceptional heart care, we have created designated Provider Care Teams.  These Care Teams include your primary Cardiologist (physician) and Advanced Practice Providers (APPs -  Physician Assistants and Nurse Practitioners) who all work together to provide you with the care you need, when you need it. . Follow up appointment with Dr.Hochrein in 4 to 6 months call 2 months before to schedule      Signed, Azalee CourseHao Carlean Crowl, PA  11/06/2018 8:59 AM    [facility] [address], [place], [place]  [address] Phone: [phone]; Fax: [phone]

## 2018-11-06 NOTE — Patient Instructions (Signed)
Medication Instructions:  Continue same medications If you need a refill on your cardiac medications before your next appointment, please call your pharmacy.   Lab work: None ordered   Testing/Procedures: None ordered  Follow-Up: At [facility], you and your health needs are our priority.  As part of our continuing mission to provide you with exceptional heart care, we have created designated Provider Care Teams.  These Care Teams include your primary Cardiologist (physician) and Advanced Practice Providers (APPs -  Physician Assistants and Nurse Practitioners) who all work together to provide you with the care you need, when you need it. . Follow up appointment with Dr.Hochrein in 4 to 6 months call 2 months before to schedule

## 2019-01-25 IMAGING — CR DG CHEST 2V
2 series · 2 of 2 positions shown · non-contrast
Comparison: 07/22/2014

CLINICAL DATA: Recent vertigo and vomiting

EXAM:
CHEST  2 VIEW

[w chest pa]
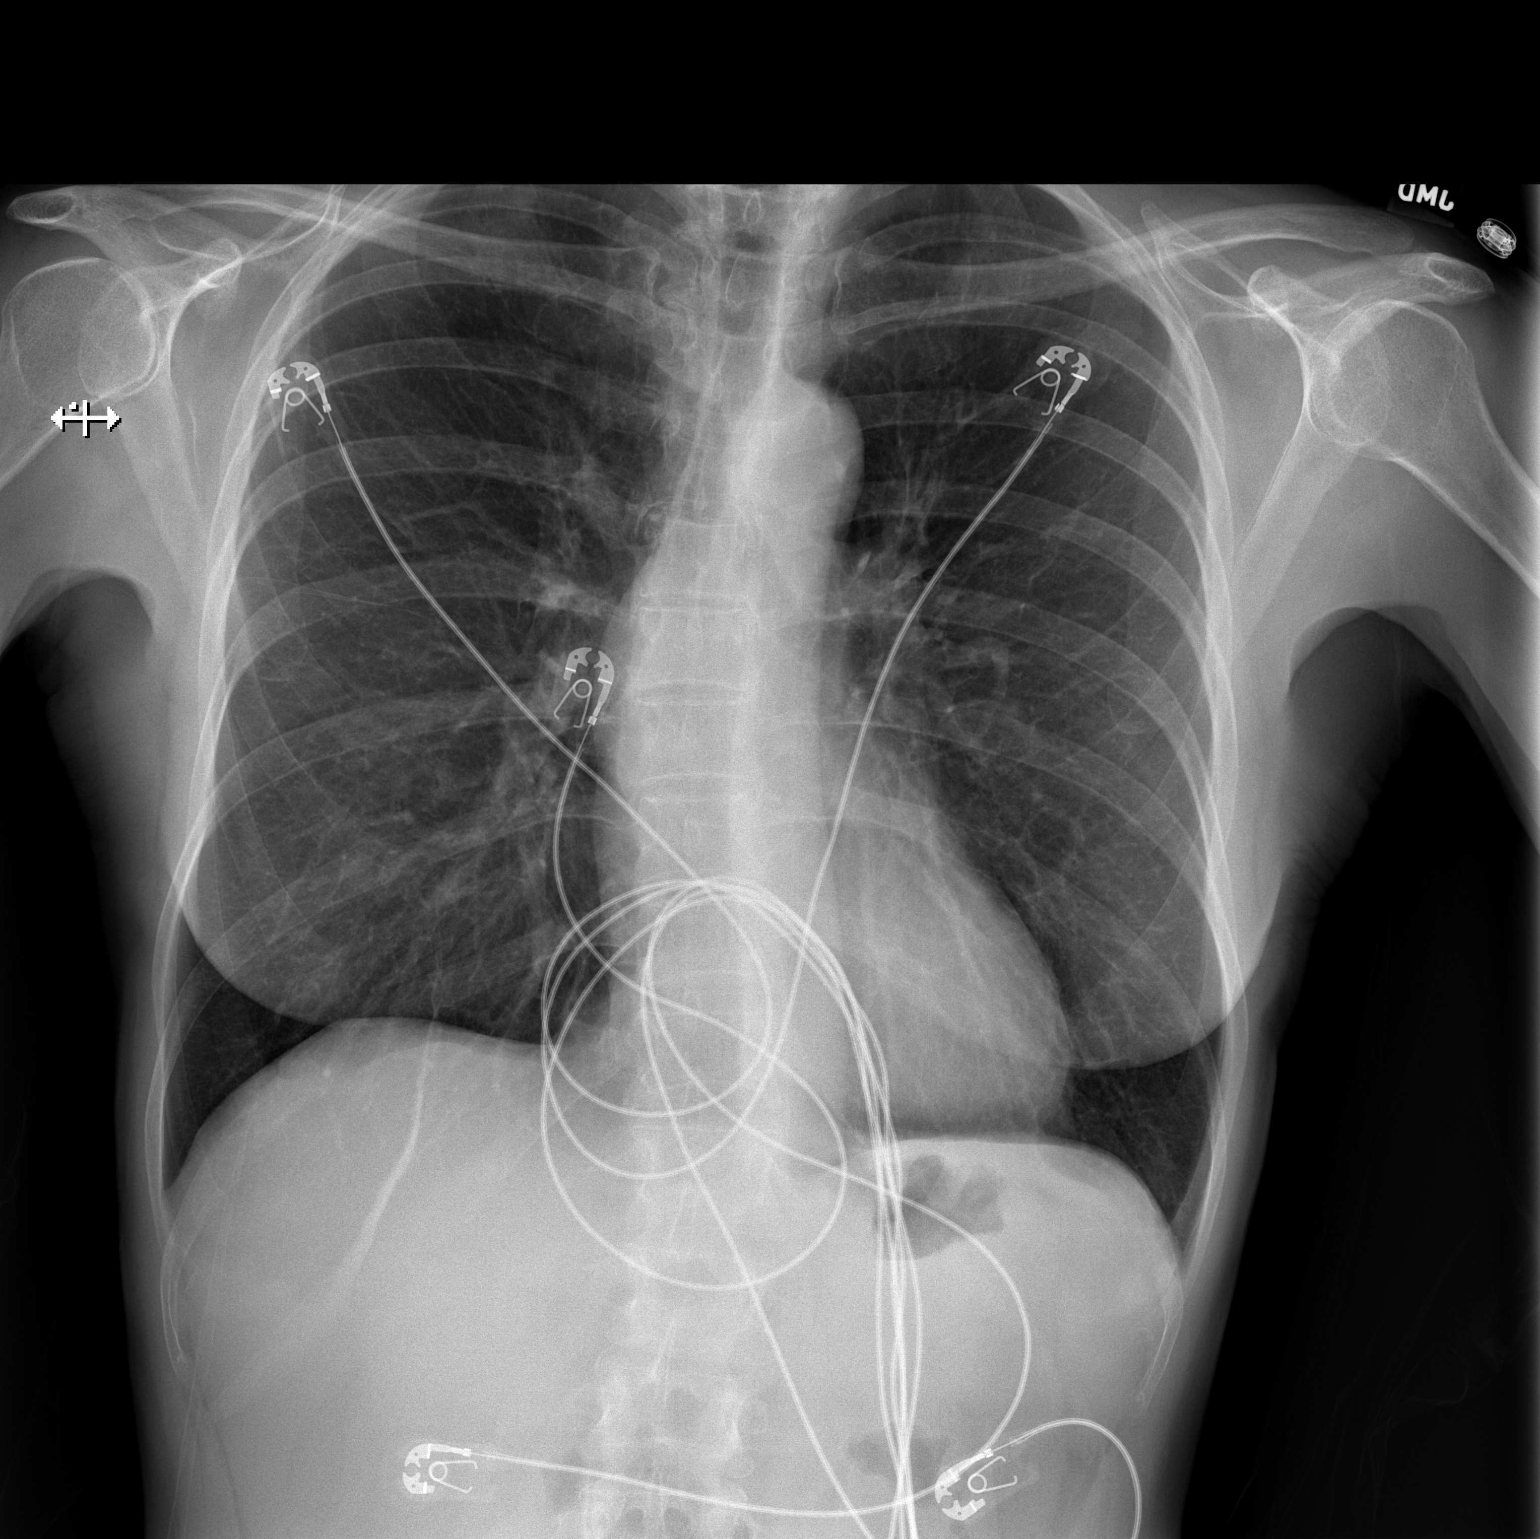

[w chest lat]
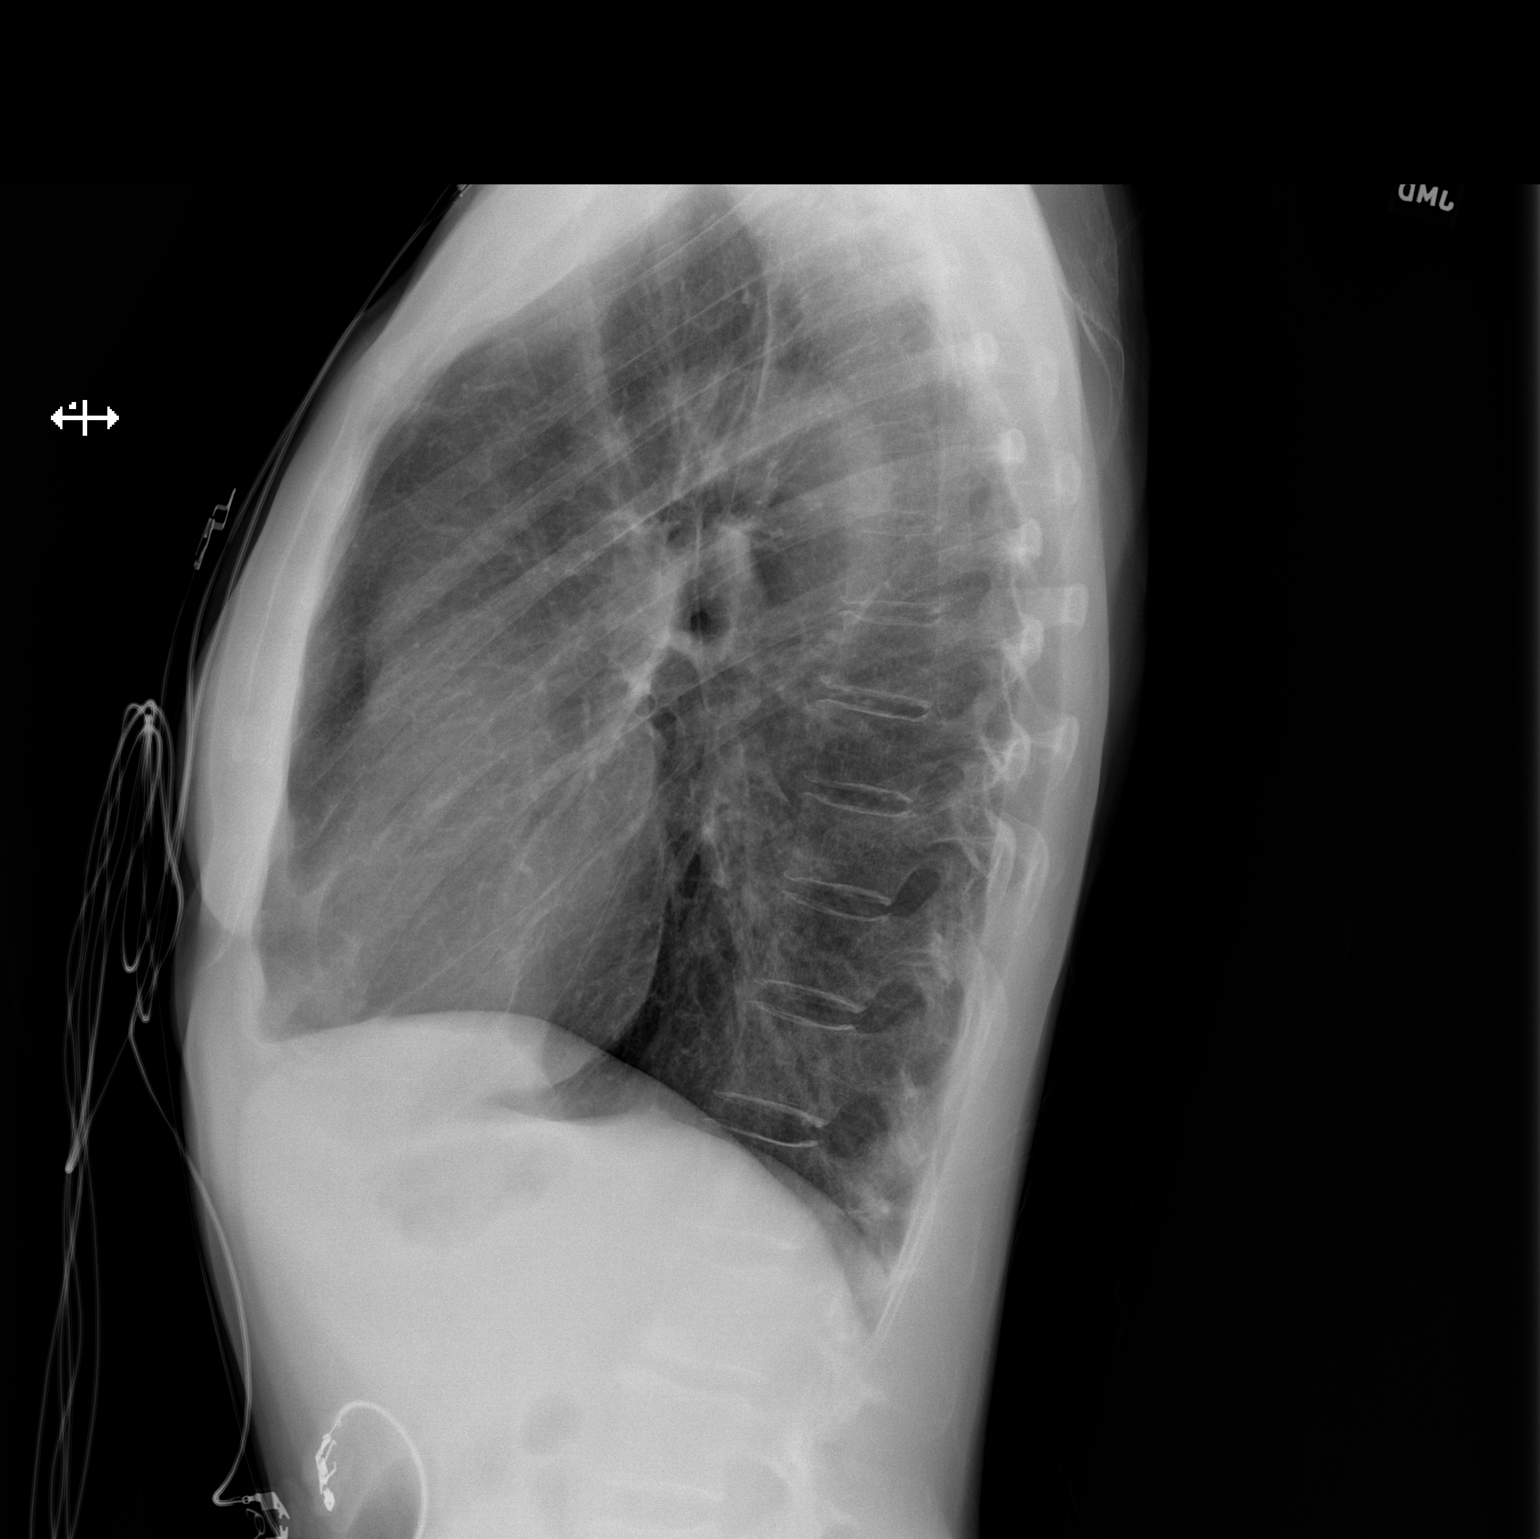

[2 of 2 positions shown; findings below may reference images not displayed]

FINDINGS: Cardiac shadow is within normal limits. The lungs are well aerated
bilaterally. No focal infiltrate or sizable effusion is seen. No
acute bony abnormality is noted.
IMPRESSION: No active cardiopulmonary disease.

## 2019-01-30 DIAGNOSIS — Z1231 Encounter for screening mammogram for malignant neoplasm of breast: Secondary | ICD-10-CM | POA: Diagnosis not present

## 2019-04-08 ENCOUNTER — Encounter: Payer: Self-pay | Admitting: Physician Assistant

## 2019-04-08 ENCOUNTER — Telehealth: Payer: BLUE CROSS/BLUE SHIELD | Admitting: Physician Assistant

## 2019-04-08 DIAGNOSIS — J019 Acute sinusitis, unspecified: Secondary | ICD-10-CM | POA: Diagnosis not present

## 2019-04-08 MED ORDER — AMOXICILLIN-POT CLAVULANATE 875-125 MG PO TABS: 1.0000 | ORAL_TABLET | Freq: Two times a day (BID) | ORAL | 0 refills | Status: DC

## 2019-04-08 NOTE — Progress Notes (Signed)
We are sorry that you are not feeling well.  Here is how we plan to help!  Based on what you have shared with me it looks like you have sinusitis.  Sinusitis is inflammation and infection in the sinus cavities of the head.  Based on your presentation I believe you most likely have Acute Bacterial Sinusitis.  This is an infection caused by bacteria and is treated with antibiotics. I have prescribed Augmentin 875mg /125mg  one tablet twice daily with food, for 7 days. You may use an oral decongestant such as Mucinex D or if you have glaucoma or high blood pressure use plain Mucinex. Saline nasal spray help and can safely be used as often as needed for congestion.  If you develop worsening sinus pain, fever or notice severe headache and vision changes, or if symptoms are not better after completion of antibiotic, please schedule an appointment with a health care provider.    Sinus infections are not as easily transmitted as other respiratory infection, however we still recommend that you avoid close contact with loved ones, especially the very young and elderly.  Remember to wash your hands thoroughly throughout the day as this is the number one way to prevent the spread of infection!  Home Care:  Only take medications as instructed by your medical team.  Complete the entire course of an antibiotic.  Do not take these medications with alcohol.  A steam or ultrasonic humidifier can help congestion.  You can place a towel over your head and breathe in the steam from hot water coming from a faucet.  Avoid close contacts especially the very young and the elderly.  Cover your mouth when you cough or sneeze.  Always remember to wash your hands.  Get Help Right Away If:  You develop worsening fever or sinus pain.  You develop a severe head ache or visual changes.  Your symptoms persist after you have completed your treatment plan.  Make sure you  Understand these instructions.  Will watch your  condition.  Will get help right away if you are not doing well or get worse.  Your e-visit answers were reviewed by a board certified advanced clinical practitioner to complete your personal care plan.  Depending on the condition, your plan could have included both over the counter or prescription medications.  If there is a problem please reply  once you have received a response from your provider.  Your safety is important to [place].  If you have drug allergies check your prescription carefully.    You can use MyChart to ask questions about today's visit, request a non-urgent call back, or ask for a work or school excuse for 24 hours related to this e-Visit. If it has been greater than 24 hours you will need to follow up with your provider, or enter a new e-Visit to address those concerns.  You will get an e-mail in the next two days asking about your experience.  I hope that your e-visit has been valuable and will speed your recovery. Thank you for using e-visits.   I have spent 7 min in completion and review of this note- Illa Level [facility]

## 2019-06-05 DIAGNOSIS — E039 Hypothyroidism, unspecified: Secondary | ICD-10-CM | POA: Diagnosis not present

## 2019-06-14 ENCOUNTER — Emergency Department (HOSPITAL_COMMUNITY)
Admission: EM | Admit: 2019-06-14 | Discharge: 2019-06-15 | Disposition: A | Discharge status: Home / Self Care | Payer: BC Managed Care – PPO | Attending: Emergency Medicine | Admitting: Emergency Medicine

## 2019-06-14 ENCOUNTER — Other Ambulatory Visit: Payer: Self-pay

## 2019-06-14 ENCOUNTER — Encounter (HOSPITAL_COMMUNITY): Payer: Self-pay | Admitting: Obstetrics and Gynecology

## 2019-06-14 DIAGNOSIS — S0003XA Contusion of scalp, initial encounter: Secondary | ICD-10-CM | POA: Diagnosis not present

## 2019-06-14 DIAGNOSIS — E039 Hypothyroidism, unspecified: Secondary | ICD-10-CM | POA: Diagnosis not present

## 2019-06-14 DIAGNOSIS — S199XXA Unspecified injury of neck, initial encounter: Secondary | ICD-10-CM | POA: Diagnosis not present

## 2019-06-14 DIAGNOSIS — S0083XA Contusion of other part of head, initial encounter: Secondary | ICD-10-CM | POA: Diagnosis not present

## 2019-06-14 DIAGNOSIS — R42 Dizziness and giddiness: Secondary | ICD-10-CM | POA: Diagnosis not present

## 2019-06-14 DIAGNOSIS — Z79899 Other long term (current) drug therapy: Secondary | ICD-10-CM | POA: Insufficient documentation

## 2019-06-14 DIAGNOSIS — F10129 Alcohol abuse with intoxication, unspecified: Secondary | ICD-10-CM | POA: Diagnosis not present

## 2019-06-14 DIAGNOSIS — L509 Urticaria, unspecified: Secondary | ICD-10-CM | POA: Diagnosis not present

## 2019-06-14 DIAGNOSIS — R21 Rash and other nonspecific skin eruption: Secondary | ICD-10-CM | POA: Diagnosis present

## 2019-06-14 MED ORDER — PREDNISONE 20 MG PO TABS
60.0000 mg | ORAL_TABLET | Freq: Once | ORAL | Status: AC
  Administered 2019-06-14: 60 mg via ORAL
  Filled 2019-06-14: qty 3

## 2019-06-14 MED ORDER — DIPHENHYDRAMINE HCL 25 MG PO CAPS
25.0000 mg | ORAL_CAPSULE | Freq: Once | ORAL | Status: AC
  Administered 2019-06-14: 25 mg via ORAL
  Filled 2019-06-14: qty 1

## 2019-06-14 MED ORDER — FAMOTIDINE 20 MG PO TABS
20.0000 mg | ORAL_TABLET | Freq: Once | ORAL | Status: AC
  Administered 2019-06-14: 20 mg via ORAL
  Filled 2019-06-14: qty 1

## 2019-06-14 NOTE — ED Provider Notes (Signed)
[facility]-EMERGENCY DEPT Provider Note   CSN: [id number] Arrival date & time: 06/14/19  2134     History   Chief Complaint Chief Complaint  Patient presents with  . Rash    HPI [name] is a [age] y.o. female.     The history is provided by the patient. No language interpreter was used.  Rash    63 year old female presenting for evaluation of a rash.  Patient report approximate 4 hours ago she was working in the yard when she was bitten by an insect to her right thigh.  Shortly after she developed itchiness and hives throughout her body.  Follows with sensation of tongue swelling and throat irritation which concerns her.  She took an WriterAveeno bath, she took 2 Zyrtec's, as well as using Benadryl cream prior to arrival.  While waiting in the ED, she felt her symptom is steadily improving.  At this time she denies lightheadedness, tongue swelling, chest pain, shortness of breath, abdominal cramping.  Her itchiness has since improved.  Past Medical History:  Diagnosis Date  . Thyroid disease     Patient Active Problem List   Diagnosis Date Noted  . Abnormal cardiac findings on abd CT- (cardiomegaly) May 2015 08/12/2014  . Cardiomegaly 07/10/2014  . Precordial chest pain 07/10/2014  . Tarsal tunnel syndrome 05/23/2012  . Bunion of great toe of right foot 05/23/2012    Past Surgical History:  Procedure Laterality Date  . AIKEN OSTEOTOMY    . APPENDECTOMY    . CORONARY ANGIOGRAM  07/22/14   normal cors  . HAMMER TOE SURGERY    . LAPAROSCOPIC OOPHERECTOMY    . LEFT HEART CATHETERIZATION WITH CORONARY ANGIOGRAM N/A 07/22/2014   Procedure: LEFT HEART CATHETERIZATION WITH CORONARY ANGIOGRAM;  Surgeon: Lennette Biharihomas A Kelly, MD;  [facility];  Service: Cardiovascular;  Laterality: N/A;  . METATARSAL OSTEOTOMY WITH BUNIONECTOMY    . NASAL SINUS SURGERY    . NEURECTOMY FOOT    . TUBAL LIGATION    . TUBAL LIGATION       OB History   No obstetric  history on file.      Home Medications    Prior to Admission medications   Medication Sig Start Date End Date Taking? Authorizing Provider  b complex vitamins capsule Take 1 capsule by mouth daily.   Yes [provider]  cetirizine (ZYRTEC) 10 MG tablet Take 10 mg by mouth daily as needed for allergies.   Yes [provider]  Cholecalciferol (VITAMIN D3) 125 MCG (5000 UT) CAPS Take 1 capsule by mouth daily.   Yes [provider]  Fish Oil-Cholecalciferol (FISH OIL + D3 PO) Take 1 capsule by mouth daily.   Yes [provider]  levothyroxine (SYNTHROID) 50 MCG tablet Take 50 mcg by mouth daily. 02/26/19  Yes [provider]  amoxicillin-clavulanate (AUGMENTIN) 875-125 MG tablet Take 1 tablet by mouth 2 (two) times daily. Patient not taking: Reported on 06/14/2019 04/08/19   Demetrio Lappingsman, Sahar M, PA-C  omeprazole (PRILOSEC) 20 MG capsule Take 1 capsule (20 mg total) by mouth daily. Patient not taking: Reported on 06/14/2019 10/31/18   Arby BarrettePfeiffer, Marcy, MD    Family History Family History  Problem Relation Age of Onset  . Cancer Father   . Heart disease Father        Pacemaker  . Alzheimer's disease Mother   . Congenital heart disease Son        [place]  Social History Social History   Tobacco Use  . Smoking status: Never Smoker  . Smokeless tobacco: Never Used  Substance Use Topics  . Alcohol use: Yes    Comment: occ  . Drug use: No     Allergies   Sulfa antibiotics and Tetanus toxoids   Review of Systems Review of Systems  Skin: Positive for rash.  All other systems reviewed and are negative.    Physical Exam Updated Vital Signs BP (!) 153/93 (BP Location: Left Arm)   Pulse (!) 56   Temp 97.7 F (36.5 C) (Oral)   Resp 16   SpO2 97%   Physical Exam Vitals signs and nursing note reviewed.  Constitutional:      General: She is not in acute distress.    Appearance: She is well-developed.  HENT:     Head: Atraumatic.      Mouth/Throat:     Comments: No oral mucosal swelling, no tongue swelling, no trismus. Eyes:     Conjunctiva/sclera: Conjunctivae normal.  Neck:     Musculoskeletal: Neck supple.  Cardiovascular:     Rate and Rhythm: Normal rate and regular rhythm.     Pulses: Normal pulses.     Heart sounds: Normal heart sounds.  Pulmonary:     Effort: Pulmonary effort is normal.     Breath sounds: Normal breath sounds. No wheezing.  Abdominal:     Palpations: Abdomen is soft.     Tenderness: There is no abdominal tenderness.  Skin:    Findings: Rash (Urticaria noted throughout body involving bilateral arms, upper chest, and right thigh) present.  Neurological:     Mental Status: She is alert and oriented to person, place, and time.  Psychiatric:        Mood and Affect: Mood normal.      ED Treatments / Results  Labs (all labs ordered are listed, but only abnormal results are displayed) Labs Reviewed - No data to display  EKG None  Radiology No results found.  Procedures Procedures (including critical care time)  Medications Ordered in ED Medications  predniSONE (DELTASONE) tablet 60 mg (60 mg Oral Given 06/14/19 2308)  diphenhydrAMINE (BENADRYL) capsule 25 mg (25 mg Oral Given 06/14/19 2308)  famotidine (PEPCID) tablet 20 mg (20 mg Oral Given 06/14/19 2309)     Initial Impression / Assessment and Plan / ED Course  I have reviewed the triage vital signs and the nursing notes.  Pertinent labs & imaging results that were available during my care of the patient were reviewed by me and considered in my medical decision making (see chart for details).        BP 127/80   Pulse 65   Temp 97.7 F (36.5 C) (Oral)   Resp 13   SpO2 99%    Final Clinical Impressions(s) / ED Diagnoses   Final diagnoses:  Urticaria    ED Discharge Orders         Ordered    diphenhydrAMINE (BENADRYL) 25 MG tablet  Every 6 hours PRN     06/15/19 0051         11:06 PM Patient developed  urticaria after being stung by an insect to her right thigh.  She initially voiced concern due to sensation of throat swelling which has since improved after she took Zyrtec's, Benadryl cream and Aveeno bath.  At this time she is resting comfortably in no acute discomfort no evidence of anaphylactic.  She still has urticaria on exam, will provide  prednisone, Benadryl, and Pepcid and will monitor.  12:50 AM After receiving treatment and was monitoring for the next 2 hours, symptoms improved, patient felt much better.  At this time she is stable for discharge.  Return precaution discussed.   Domenic Moras, PA-C 06/15/19 [id number]    Milton Ferguson, MD 06/16/19 1011

## 2019-06-14 NOTE — ED Triage Notes (Signed)
Pt reports she was out in the garden today and believes she was bit by a bug. On her right leg she has swelling and itching. She also has swelling and itching to the left axilla region and reports swelling in her mouth.  Pt is maintaining her airway and in no active distress at this time.

## 2019-06-15 ENCOUNTER — Emergency Department (HOSPITAL_COMMUNITY): Payer: BC Managed Care – PPO

## 2019-06-15 ENCOUNTER — Emergency Department (HOSPITAL_COMMUNITY)
Admission: EM | Admit: 2019-06-15 | Discharge: 2019-06-16 | Disposition: A | Discharge status: Home / Self Care | Payer: BC Managed Care – PPO | Source: Home / Self Care | Attending: Emergency Medicine | Admitting: Emergency Medicine

## 2019-06-15 ENCOUNTER — Encounter (HOSPITAL_COMMUNITY): Payer: Self-pay | Admitting: Emergency Medicine

## 2019-06-15 ENCOUNTER — Other Ambulatory Visit: Payer: Self-pay

## 2019-06-15 DIAGNOSIS — S0083XA Contusion of other part of head, initial encounter: Secondary | ICD-10-CM

## 2019-06-15 DIAGNOSIS — J80 Acute respiratory distress syndrome: Secondary | ICD-10-CM | POA: Diagnosis not present

## 2019-06-15 DIAGNOSIS — F10129 Alcohol abuse with intoxication, unspecified: Secondary | ICD-10-CM | POA: Diagnosis not present

## 2019-06-15 DIAGNOSIS — W19XXXA Unspecified fall, initial encounter: Secondary | ICD-10-CM

## 2019-06-15 DIAGNOSIS — R52 Pain, unspecified: Secondary | ICD-10-CM | POA: Diagnosis not present

## 2019-06-15 DIAGNOSIS — R404 Transient alteration of awareness: Secondary | ICD-10-CM | POA: Diagnosis not present

## 2019-06-15 DIAGNOSIS — R58 Hemorrhage, not elsewhere classified: Secondary | ICD-10-CM | POA: Diagnosis not present

## 2019-06-15 LAB — ETHANOL: Alcohol, Ethyl (B): 277 mg/dL — ABNORMAL HIGH (ref <10)

## 2019-06-15 MED ORDER — ONDANSETRON HCL 4 MG/2ML IJ SOLN
4.0000 mg | Freq: Once | INTRAMUSCULAR | Status: AC
  Administered 2019-06-16: 4 mg via INTRAVENOUS
  Filled 2019-06-15: qty 2

## 2019-06-15 MED ORDER — SODIUM CHLORIDE 0.9 % IV BOLUS
1000.0000 mL | Freq: Once | INTRAVENOUS | Status: AC
  Administered 2019-06-16: 1000 mL via INTRAVENOUS

## 2019-06-15 MED ORDER — DIPHENHYDRAMINE HCL 25 MG PO TABS: 25.0000 mg | ORAL_TABLET | Freq: Four times a day (QID) | ORAL | 0 refills | Status: DC | PRN

## 2019-06-15 NOTE — ED Provider Notes (Signed)
[facility]   CSN: [id number] Arrival date & time: 06/15/19  2217    History   Chief Complaint Chief Complaint  Patient presents with   Fall   Alcohol Intoxication    HPI [name] is a 63 y.o. female brought in by EMS for evaluation of fall.  Per EMS, patient had been drinking tequila and had a fall from standing.  Patient 1 times episode of vomiting prior to ED arrival.  Patient does endorse drinking alcohol.  She states that she drank "a lot of tequila."  Patient states she is not on blood thinners.  EM LEVEL 5 CAVEAT DUE TO ALCOHOL INTOXICATION     The history is provided by the EMS personnel.    Past Medical History:  Diagnosis Date   Thyroid disease     Patient Active Problem List   Diagnosis Date Noted   Abnormal cardiac findings on abd CT- (cardiomegaly) May 2015 08/12/2014   Cardiomegaly 07/10/2014   Precordial chest pain 07/10/2014   Tarsal tunnel syndrome 05/23/2012   Bunion of great toe of right foot 05/23/2012    Past Surgical History:  Procedure Laterality Date   AIKEN OSTEOTOMY     APPENDECTOMY     CORONARY ANGIOGRAM  07/22/14   normal cors   HAMMER TOE SURGERY     LAPAROSCOPIC OOPHERECTOMY     LEFT HEART CATHETERIZATION WITH CORONARY ANGIOGRAM N/A 07/22/2014   Procedure: LEFT HEART CATHETERIZATION WITH CORONARY ANGIOGRAM;  Surgeon: Lennette Biharihomas A Kelly, MD;  [facility];  Service: Cardiovascular;  Laterality: N/A;   METATARSAL OSTEOTOMY WITH BUNIONECTOMY     NASAL SINUS SURGERY     NEURECTOMY FOOT     TUBAL LIGATION     TUBAL LIGATION       OB History   No obstetric history on file.      Home Medications    Prior to Admission medications   Medication Sig Start Date End Date Taking? Authorizing Provider  b complex vitamins capsule Take 1 capsule by mouth daily.   Yes [provider]  Cholecalciferol (VITAMIN D3) 125 MCG (5000 UT) CAPS Take 1 capsule by  mouth daily.   Yes [provider]  diphenhydrAMINE (BENADRYL) 25 MG tablet Take 1 tablet (25 mg total) by mouth every 6 (six) hours as needed for itching or allergies. 06/15/19  Yes Fayrene Helperran, [place], PA-C  Fish Oil-Cholecalciferol (FISH OIL + D3 PO) Take 1 capsule by mouth daily.   Yes [provider]  levothyroxine (SYNTHROID) 50 MCG tablet Take 50 mcg by mouth daily. 02/26/19  Yes [provider]  omeprazole (PRILOSEC) 20 MG capsule Take 1 capsule (20 mg total) by mouth daily. Patient not taking: Reported on 06/14/2019 10/31/18 06/15/19  Arby BarrettePfeiffer, Marcy, MD    Family History Family History  Problem Relation Age of Onset   Cancer Father    Heart disease Father        Pacemaker   Alzheimer's disease Mother    Congenital heart disease Son        Coarc    Social History Social History   Tobacco Use   Smoking status: Never Smoker   Smokeless tobacco: Never Used  Substance Use Topics   Alcohol use: Yes    Comment: occ   Drug use: No     Allergies   Sulfa antibiotics and Tetanus toxoids   Review of Systems Review of Systems  Unable to perform ROS: Mental status change  Physical Exam Updated Vital Signs BP 114/75    Pulse 71    Resp 18    Ht 5\' 6"  (1.676 m)    Wt 59 kg    SpO2 99%    BMI 20.98 kg/m   Physical Exam Vitals signs and nursing note reviewed.  Constitutional:      Appearance: Normal appearance. She is well-developed.  HENT:     Head: Normocephalic and atraumatic.      Comments: Dried blood surrounding head and face. Eyes:     General: Lids are normal.     Conjunctiva/sclera: Conjunctivae normal.     Pupils: Pupils are equal, round, and reactive to light.     Comments: PERRL. EOMS intact.   Neck:     Musculoskeletal: Full passive range of motion without pain.  Cardiovascular:     Rate and Rhythm: Normal rate and regular rhythm.     Pulses: Normal pulses.     Heart sounds: Normal heart sounds. No murmur. No friction rub.  No gallop.   Pulmonary:     Effort: Pulmonary effort is normal.     Breath sounds: Normal breath sounds.     Comments: Lungs clear to auscultation bilaterally.  Symmetric chest rise.  No wheezing, rales, rhonchi. Abdominal:     Palpations: Abdomen is soft. Abdomen is not rigid.     Tenderness: There is no abdominal tenderness. There is no guarding.     Comments: Abdomen is soft, non-distended, non-tender. No rigidity, No guarding. No peritoneal signs.  Musculoskeletal: Normal range of motion.  Skin:    General: Skin is warm and dry.     Capillary Refill: Capillary refill takes less than 2 seconds.  Neurological:     Mental Status: She is alert and oriented to person, place, and time.     Comments: Cranial nerves III-XII intact Follows commands, Moves all extremities  5/5 strength to BUE and BLE  Sensation intact throughout all major nerve distributions No slurred speech. No facial droop.  Answers most questions appropriately.  Psychiatric:        Speech: Speech normal.      ED Treatments / Results  Labs (all labs ordered are listed, but only abnormal results are displayed) Labs Reviewed  ETHANOL - Abnormal; Notable for the following components:      Result Value   Alcohol, Ethyl (B) 277 (*)    All other components within normal limits    EKG None  Radiology Ct Head Wo Contrast  Result Date: 06/16/2019 CLINICAL DATA:  64 y/o F; status post fall with laceration to right forehead. Dizziness and vomiting. EXAM: CT HEAD WITHOUT CONTRAST CT MAXILLOFACIAL WITHOUT CONTRAST CT CERVICAL SPINE WITHOUT CONTRAST TECHNIQUE: Multidetector CT imaging of the head, cervical spine, and maxillofacial structures were performed using the standard protocol without intravenous contrast. Multiplanar CT image reconstructions of the cervical spine and maxillofacial structures were also generated. COMPARISON:  02/27/2018 MRI head. FINDINGS: CT HEAD FINDINGS Brain: No evidence of acute infarction,  hemorrhage, hydrocephalus, extra-axial collection or mass lesion/mass effect. Vascular: No hyperdense vessel or unexpected calcification. Skull: Right frontal scalp contusion with 16 mm hematoma. No calvarial fracture. Other: None. CT MAXILLOFACIAL FINDINGS Osseous: No fracture or mandibular dislocation. No destructive process. Orbits: Negative. No traumatic or inflammatory finding. Sinuses: Clear. Soft tissues: Right anterolateral frontal scalp contusion with small hematoma. CT CERVICAL SPINE FINDINGS Alignment: Normal. Skull base and vertebrae: No acute fracture. No primary bone lesion or focal pathologic process. Soft tissues and spinal canal:  No prevertebral fluid or swelling. No visible canal hematoma. Disc levels: Mild-to-moderate discogenic degenerative changes at the C4-C7 levels and predominantly upper cervical facet arthrosis. Uncovertebral and facet hypertrophy encroach on the neural foramen at the right C3-4, left C4-5, bilateral C5-6, and bilateral C6-7 levels. No high-grade bony spinal canal stenosis. Upper chest: Negative. Other: Negative. IMPRESSION: 1. Right anterolateral frontal scalp contusion and small hematoma. 2. No calvarial fracture. 3. No acute intracranial abnormality. 4. No acute facial fracture or mandibular dislocation. 5. No acute fracture or dislocation of cervical spine. 6. Mild-to-moderate cervical spondylosis predominantly at the C4-C7 levels. Electronically Signed   By: Mitzi HansenLance  Furusawa-Stratton M.D.   On: 06/16/2019 00:26   Ct Cervical Spine Wo Contrast  Result Date: 06/16/2019 CLINICAL DATA:  63 y/o F; status post fall with laceration to right forehead. Dizziness and vomiting. EXAM: CT HEAD WITHOUT CONTRAST CT MAXILLOFACIAL WITHOUT CONTRAST CT CERVICAL SPINE WITHOUT CONTRAST TECHNIQUE: Multidetector CT imaging of the head, cervical spine, and maxillofacial structures were performed using the standard protocol without intravenous contrast. Multiplanar CT image reconstructions of  the cervical spine and maxillofacial structures were also generated. COMPARISON:  02/27/2018 MRI head. FINDINGS: CT HEAD FINDINGS Brain: No evidence of acute infarction, hemorrhage, hydrocephalus, extra-axial collection or mass lesion/mass effect. Vascular: No hyperdense vessel or unexpected calcification. Skull: Right frontal scalp contusion with 16 mm hematoma. No calvarial fracture. Other: None. CT MAXILLOFACIAL FINDINGS Osseous: No fracture or mandibular dislocation. No destructive process. Orbits: Negative. No traumatic or inflammatory finding. Sinuses: Clear. Soft tissues: Right anterolateral frontal scalp contusion with small hematoma. CT CERVICAL SPINE FINDINGS Alignment: Normal. Skull base and vertebrae: No acute fracture. No primary bone lesion or focal pathologic process. Soft tissues and spinal canal: No prevertebral fluid or swelling. No visible canal hematoma. Disc levels: Mild-to-moderate discogenic degenerative changes at the C4-C7 levels and predominantly upper cervical facet arthrosis. Uncovertebral and facet hypertrophy encroach on the neural foramen at the right C3-4, left C4-5, bilateral C5-6, and bilateral C6-7 levels. No high-grade bony spinal canal stenosis. Upper chest: Negative. Other: Negative. IMPRESSION: 1. Right anterolateral frontal scalp contusion and small hematoma. 2. No calvarial fracture. 3. No acute intracranial abnormality. 4. No acute facial fracture or mandibular dislocation. 5. No acute fracture or dislocation of cervical spine. 6. Mild-to-moderate cervical spondylosis predominantly at the C4-C7 levels. Electronically Signed   By: Mitzi HansenLance  Furusawa-Stratton M.D.   On: 06/16/2019 00:26   Ct Maxillofacial Wo Contrast  Result Date: 06/16/2019 CLINICAL DATA:  63 y/o F; status post fall with laceration to right forehead. Dizziness and vomiting. EXAM: CT HEAD WITHOUT CONTRAST CT MAXILLOFACIAL WITHOUT CONTRAST CT CERVICAL SPINE WITHOUT CONTRAST TECHNIQUE: Multidetector CT imaging  of the head, cervical spine, and maxillofacial structures were performed using the standard protocol without intravenous contrast. Multiplanar CT image reconstructions of the cervical spine and maxillofacial structures were also generated. COMPARISON:  02/27/2018 MRI head. FINDINGS: CT HEAD FINDINGS Brain: No evidence of acute infarction, hemorrhage, hydrocephalus, extra-axial collection or mass lesion/mass effect. Vascular: No hyperdense vessel or unexpected calcification. Skull: Right frontal scalp contusion with 16 mm hematoma. No calvarial fracture. Other: None. CT MAXILLOFACIAL FINDINGS Osseous: No fracture or mandibular dislocation. No destructive process. Orbits: Negative. No traumatic or inflammatory finding. Sinuses: Clear. Soft tissues: Right anterolateral frontal scalp contusion with small hematoma. CT CERVICAL SPINE FINDINGS Alignment: Normal. Skull base and vertebrae: No acute fracture. No primary bone lesion or focal pathologic process. Soft tissues and spinal canal: No prevertebral fluid or swelling. No visible canal hematoma. Disc levels:  Mild-to-moderate discogenic degenerative changes at the C4-C7 levels and predominantly upper cervical facet arthrosis. Uncovertebral and facet hypertrophy encroach on the neural foramen at the right C3-4, left C4-5, bilateral C5-6, and bilateral C6-7 levels. No high-grade bony spinal canal stenosis. Upper chest: Negative. Other: Negative. IMPRESSION: 1. Right anterolateral frontal scalp contusion and small hematoma. 2. No calvarial fracture. 3. No acute intracranial abnormality. 4. No acute facial fracture or mandibular dislocation. 5. No acute fracture or dislocation of cervical spine. 6. Mild-to-moderate cervical spondylosis predominantly at the C4-C7 levels. Electronically Signed   By: Mitzi HansenLance  Furusawa-Stratton M.D.   On: 06/16/2019 00:26    Procedures Procedures (including critical care time)  Medications Ordered in ED Medications  sodium chloride 0.9 %  bolus 1,000 mL (0 mLs Intravenous Stopped 06/16/19 0311)  ondansetron (ZOFRAN) injection 4 mg (4 mg Intravenous Given 06/16/19 0033)     Initial Impression / Assessment and Plan / ED Course  I have reviewed the triage vital signs and the nursing notes.  Pertinent labs & imaging results that were available during my care of the patient were reviewed by me and considered in my medical decision making (see chart for details).        63 year old female brought in by EMS for evaluation of alcohol intoxication and fall.  Patient does endorse drinking tequila though cannot quantify how much she has been drinking.  She states she is not on blood thinners.  Clinically, she appears intoxicated.  She is able to answer some questions appropriately.  No neuro deficits noted on exam.  There is a report of vomiting after fall.  Will plan for CT head, C-spine.  Plan for wound care.  Patient states she cannot have a tetanus shot because she is allergic.  Ethanol is elevated for 277. Plan for fluids.   CT head shows no acute intracranial hemorrhage.  CT maxillofacial shows no evidence of facial fracture or mandibular dislocation.  CT of cervical spine shows no acute fracture or dislocation.  Right anterior lateral frontal scalp contusion and small hematoma noted.  Reevaluation after patient cleaned.  Dried blood removed from hair and face.  Evaluation of her scalp shows no evidence of scalp laceration. She has scattered abrasions, particularly to the right side of her head but no lacerations that would require suturing. Evaluation of the hematoma after it was cleaned shows no evidence of laceration. I was suspect the blood was from the hematoma, particularly the end where it was adjacent to an abrasion. This area was examined and showed no laceration. Patient is answering questions and talking without any difficulty. She has been ambulatory in the ED without difficulty. She appears clinically soberr. At this time,  patient exhibits no emergent life-threatening condition that require further evaluation in ED. Patient had ample opportunity for questions and discussion. All patient's questions were answered with full understanding. Strict return precautions discussed. Patient expresses understanding and agreement to plan.   Portions of this note were generated with Scientist, clinical (histocompatibility and immunogenetics)Dragon dictation software. Dictation errors may occur despite best attempts at proofreading.   Final Clinical Impressions(s) / ED Diagnoses   Final diagnoses:  Fall, initial encounter  Contusion of other part of head, initial encounter    ED Discharge Orders    None       Rosana HoesLayden, Shelvy Heckert A, PA-C 06/17/19 [id number]    Palumbo, April, MD 06/18/19 2357

## 2019-06-15 NOTE — ED Notes (Addendum)
Bed: JT70 Expected date:  Expected time:  Means of arrival:  Comments: EMS 63 yo female fall-GCS 15 -head lac BP103 after 500 cc's NS

## 2019-06-15 NOTE — ED Notes (Signed)
Contact is husband: Kalynne Womac at [phone] call with updates

## 2019-06-15 NOTE — ED Triage Notes (Signed)
Patient [name] from home for fall. Pt is intoxicated, had a same level fall, reported some dizziness to EMS and vomited x1 just before EMS arrived. Pt denies LOC. Laceration noted to rt forehead, bleeding controled at this time. Pt a/o x4 but slow to answer questions.

## 2019-06-16 DIAGNOSIS — S199XXA Unspecified injury of neck, initial encounter: Secondary | ICD-10-CM | POA: Diagnosis not present

## 2019-06-16 DIAGNOSIS — S0003XA Contusion of scalp, initial encounter: Secondary | ICD-10-CM | POA: Diagnosis not present

## 2019-06-16 DIAGNOSIS — R42 Dizziness and giddiness: Secondary | ICD-10-CM | POA: Diagnosis not present

## 2019-06-16 NOTE — ED Notes (Signed)
Patients husband updated with patients permission.

## 2019-06-18 DIAGNOSIS — Z20828 Contact with and (suspected) exposure to other viral communicable diseases: Secondary | ICD-10-CM | POA: Diagnosis not present

## 2019-06-24 DIAGNOSIS — S0990XD Unspecified injury of head, subsequent encounter: Secondary | ICD-10-CM | POA: Diagnosis not present

## 2019-06-26 DIAGNOSIS — S0990XD Unspecified injury of head, subsequent encounter: Secondary | ICD-10-CM | POA: Diagnosis not present

## 2019-06-27 DIAGNOSIS — E039 Hypothyroidism, unspecified: Secondary | ICD-10-CM | POA: Diagnosis not present

## 2019-07-03 DIAGNOSIS — S0990XD Unspecified injury of head, subsequent encounter: Secondary | ICD-10-CM | POA: Diagnosis not present

## 2019-07-22 NOTE — Progress Notes (Deleted)
Cardiology Office Note   Date:  07/22/2019   ID:  [name], [name] June 05, 1956, MRN [id number]  PCP:  Lawerance Cruel, MD  Cardiologist:   No primary care provider on file. Referring:  ***  No chief complaint on file.     History of Present Illness: [name] is a 63 y.o. female who presents for follow up of chest pain.  She had a CT cardiac score in August 2015 that showed calcium score of 100 which placed her at 7 percentile for age and sex matched control, she also had mildly dilated aortic root measuring 42 mm. She had a cardiac catheterization in 2015 that demonstrated no coronary artery disease but there was some calcification proximally in the LAD. Echocardiogram obtained on 07/13/2014 showed EF 55 to 60%, theechocardiogram did not demonstrate any aortic root enlargement. She also has a history of palpitation.  In 2019 she had chest pain and palpitations and she had a low risk nuclear stress test.  She had a stable CT aorta of the chest.  She had rare atrial tach on Holter.   ***   Dr. Percival Spanish recommended cutting back on her caffeine, and if her symptom does not improve we will consider Holter monitor. She was given a limited dose of Xanax during the last office visit to help with palpitation at night and to sleep. Because she has resting bradycardia with heart rate of 48 at the time, she was not placed on beta-blocker.  I last saw the patient on 10/23/2018, she complained of both chest pain and palpitation.  Her chest pain was very atypical.  I ordered both treadmill stress test and 48-hour Holter monitor.  Before she had the chance of completing her treadmill test, she presented to ED on 10/31/2018.  CT angiogram of the chest showed a stable 4.1 cm a sending thoracic aortic aneurysm.  Treadmill nuclear stress test was low risk as well, patient was able to achieve 16.7 METS without significant EKG abnormality.  48-hour Holter monitor showed 5 beats run of atrial  tachycardia, rare supraventricular beats, otherwise mainly normal sinus rhythm.  She recently self discontinued her Synthroid, she says she had very similar symptoms in the past when she is taking too much thyroid medication.  I will defer this management and monitoring to her primary care provider.  However she says since her ED visit, she has been doing extremely well.  She has went back to running which she used to love.  It is a great stress reliever.  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  She says her recent chest discomfort actually felt much better after running.  She has not had any more chest pain after she stopped Synthroid medication.  Otherwise she does not have any lower extremity edema, orthopnea or PND.   Past Medical History:  Diagnosis Date  . Thyroid disease     Past Surgical History:  Procedure Laterality Date  . AIKEN OSTEOTOMY    . APPENDECTOMY    . CORONARY ANGIOGRAM  07/22/14   normal cors  . HAMMER TOE SURGERY    . LAPAROSCOPIC OOPHERECTOMY    . LEFT HEART CATHETERIZATION WITH CORONARY ANGIOGRAM N/A 07/22/2014   Procedure: LEFT HEART CATHETERIZATION WITH CORONARY ANGIOGRAM;  Surgeon: Troy Sine, MD;  Location: [facility] CATH LAB;  Service: Cardiovascular;  Laterality: N/A;  . METATARSAL OSTEOTOMY WITH BUNIONECTOMY    . NASAL SINUS SURGERY    .  NEURECTOMY FOOT    . TUBAL LIGATION    . TUBAL LIGATION       Current Outpatient Medications  Medication Sig Dispense Refill  . b complex vitamins capsule Take 1 capsule by mouth daily.    . Cholecalciferol (VITAMIN D3) 125 MCG (5000 UT) CAPS Take 1 capsule by mouth daily.    . diphenhydrAMINE (BENADRYL) 25 MG tablet Take 1 tablet (25 mg total) by mouth every 6 (six) hours as needed for itching or allergies. 30 tablet 0  . Fish Oil-Cholecalciferol (FISH OIL + D3 PO) Take 1 capsule by mouth daily.    Marland Kitchen. levothyroxine (SYNTHROID) 50 MCG tablet Take 50 mcg by mouth daily.     No current  facility-administered medications for this visit.     Allergies:   Sulfa antibiotics and Tetanus toxoids    ROS:  Please see the history of present illness.   Otherwise, review of systems are positive for {NONE DEFAULTED:18576::"none"}.   All other systems are reviewed and negative.    PHYSICAL EXAM: VS:  There were no vitals taken for this visit. , BMI There is no height or weight on file to calculate BMI. GENERAL:  Well appearing NECK:  No jugular venous distention, waveform within normal limits, carotid upstroke brisk and symmetric, no bruits, no thyromegaly LUNGS:  Clear to auscultation bilaterally CHEST:  Unremarkable HEART:  PMI not displaced or sustained,S1 and S2 within normal limits, no S3, no S4, no clicks, no rubs, *** murmurs ABD:  Flat, positive bowel sounds normal in frequency in pitch, no bruits, no rebound, no guarding, no midline pulsatile mass, no hepatomegaly, no splenomegaly EXT:  2 plus pulses throughout, no edema, no cyanosis no clubbing     ***GENERAL:  Well appearing HEENT:  Pupils equal round and reactive, fundi not visualized, oral mucosa unremarkable NECK:  No jugular venous distention, waveform within normal limits, carotid upstroke brisk and symmetric, no bruits, no thyromegaly LYMPHATICS:  No cervical, inguinal adenopathy LUNGS:  Clear to auscultation bilaterally BACK:  No CVA tenderness CHEST:  Unremarkable HEART:  PMI not displaced or sustained,S1 and S2 within normal limits, no S3, no S4, no clicks, no rubs, *** murmurs ABD:  Flat, positive bowel sounds normal in frequency in pitch, no bruits, no rebound, no guarding, no midline pulsatile mass, no hepatomegaly, no splenomegaly EXT:  2 plus pulses throughout, no edema, no cyanosis no clubbing SKIN:  No rashes no nodules NEURO:  Cranial nerves II through XII grossly intact, motor grossly intact throughout PSYCH:  Cognitively intact, oriented to person place and time    EKG:  EKG {ACTION; IS/IS  ZOX:[id number]}OT:[id number]} ordered today. The ekg ordered today demonstrates ***   Recent Labs: 10/31/2018: ALT 23; B Natriuretic Peptide 13.5; BUN 14; Creatinine, Ser 0.71; Hemoglobin 14.2; Platelets 267; Potassium 3.7; Sodium 137    Lipid Panel    Component Value Date/Time   CHOL 223 (H) 07/21/2014 1006   TRIG 104 07/21/2014 1006   HDL 71 07/21/2014 1006   CHOLHDL 3.1 07/21/2014 1006   VLDL 21 07/21/2014 1006   LDLCALC 131 (H) 07/21/2014 1006      Wt Readings from Last 3 Encounters:  06/15/19 130 lb (59 kg)  11/06/18 126 lb 3.2 oz (57.2 kg)  10/31/18 122 lb (55.3 kg)      Other studies Reviewed: Additional studies/ records that were reviewed today include: ***. Review of the above records demonstrates:  Please see elsewhere in the note.  ***   ASSESSMENT AND PLAN:  Atypical chest pain:   ***  She is able to complete a 16.7 METS of activity on the treadmill without any chest pain.  In fact when she runs, her chest pain feels better.  She has not had any further episode, no further work-up is recommended at this time.  During her recent ED visit, troponin was negative  Ascending aortic and aneurysm:   This was stable last fall.  ***   Initially seen on coronary CT in 2015, recent repeat CT angiogram of the chest reveals 4.1 cm a sending thoracic aortic aneurysm.  We will continue to follow-up on a yearly basis  History of bradycardia:   ***  She has asymptomatic relative bradycardia.  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  ***  Recent 48-hour monitor does reveal occasional atrial tachycardia, symptoms improved after she stopped her Synthroid  Hypothyroidism: ***   Patient self discontinued Synthroid due to fear of hyperthyroidism, this will need to be managed and monitored by primary care provider.    Current medicines are reviewed at length with the patient today.  The patient {ACTIONS; HAS/DOES NOT  HAVE:19233} concerns regarding medicines.  The following changes have been made:  {PLAN; NO CHANGE:13088:s}  Labs/ tests ordered today include: *** No orders of the defined types were placed in this encounter.    Disposition:   FU with ***    Signed, Rollene RotundaJames Tytus Strahle, MD  07/22/2019 4:15 PM    [facility]

## 2019-07-23 ENCOUNTER — Telehealth: Payer: Self-pay | Admitting: *Deleted

## 2019-07-23 ENCOUNTER — Encounter: Payer: Self-pay | Admitting: *Deleted

## 2019-07-23 NOTE — Telephone Encounter (Signed)
Left message to call back Appointment for in am tomorrow needs to be moved to afternoon, Dr Percival Spanish has meeting

## 2019-07-24 ENCOUNTER — Ambulatory Visit: Payer: BC Managed Care – PPO | Admitting: Cardiology

## 2019-07-25 NOTE — Telephone Encounter (Signed)
Patient has appointment 8/27

## 2019-08-05 DIAGNOSIS — E039 Hypothyroidism, unspecified: Secondary | ICD-10-CM | POA: Diagnosis not present

## 2019-08-05 DIAGNOSIS — L659 Nonscarring hair loss, unspecified: Secondary | ICD-10-CM | POA: Diagnosis not present

## 2019-08-05 DIAGNOSIS — K900 Celiac disease: Secondary | ICD-10-CM | POA: Diagnosis not present

## 2019-08-07 DIAGNOSIS — H2513 Age-related nuclear cataract, bilateral: Secondary | ICD-10-CM | POA: Diagnosis not present

## 2019-08-07 DIAGNOSIS — H1789 Other corneal scars and opacities: Secondary | ICD-10-CM | POA: Diagnosis not present

## 2019-08-07 DIAGNOSIS — H524 Presbyopia: Secondary | ICD-10-CM | POA: Diagnosis not present

## 2019-08-07 NOTE — Progress Notes (Signed)
Cardiology Office Note   Date:  08/08/2019   ID:  Jane, [name] 10-30-1956, MRN [id number]  PCP:  Lawerance Cruel, MD  Cardiologist:   No primary care provider on file.   No chief complaint on file.     History of Present Illness: [name] is a 63 y.o. female who presents for follow up of chest pain.  She had a CT cardiac score in August 2015 that showed calcium score of 100 which placed her at 25 percentile for age and sex matched control, she also had mildly dilated aortic root measuring 42 mm. She had a cardiac catheterization in 2015 that demonstrated no coronary artery disease but there was some calcification proximally in the LAD. Echocardiogram obtained on 07/13/2014 showed EF 55 to 60%, theechocardiogram did not demonstrate any aortic root enlargement. She also has a history of palpitation.  In 2019 she had chest pain and palpitations and she had a low risk nuclear stress test.  She had a stable CT aorta of the chest.  She had rare atrial tach on Holter.     Since I last saw her she has had no new cardiovascular complaints.  Unfortunately she has a lot of stress at work.  She will notice sometimes not feeling well.  Having palpitations and maybe some chest discomfort and her blood pressure will start to go up.  She has recorded some blood pressure readings of 200/100.  She comes in 2 weeks he will come back down.  She otherwise typically has blood pressures in the 120s over 70s.  She denies any other cardiovascular symptoms.  She tries to remain physically active.   Past Medical History:  Diagnosis Date  . Thyroid disease     Past Surgical History:  Procedure Laterality Date  . AIKEN OSTEOTOMY    . APPENDECTOMY    . CORONARY ANGIOGRAM  07/22/14   normal cors  . HAMMER TOE SURGERY    . LAPAROSCOPIC OOPHERECTOMY    . LEFT HEART CATHETERIZATION WITH CORONARY ANGIOGRAM N/A 07/22/2014   Procedure: LEFT HEART CATHETERIZATION WITH CORONARY ANGIOGRAM;  Surgeon:  Troy Sine, MD;  Location: [facility] CATH LAB;  Service: Cardiovascular;  Laterality: N/A;  . METATARSAL OSTEOTOMY WITH BUNIONECTOMY    . NASAL SINUS SURGERY    . NEURECTOMY FOOT    . TUBAL LIGATION    . TUBAL LIGATION       Current Outpatient Medications  Medication Sig Dispense Refill  . b complex vitamins capsule Take 1 capsule by mouth daily.    . Cholecalciferol (VITAMIN D3) 125 MCG (5000 UT) CAPS Take 1 capsule by mouth daily.    . Fish Oil-Cholecalciferol (FISH OIL + D3 PO) Take 1 capsule by mouth daily.    Marland Kitchen levothyroxine (SYNTHROID) 50 MCG tablet Take 50 mcg by mouth daily.     No current facility-administered medications for this visit.     Allergies:   Sulfa antibiotics and Tetanus toxoids    ROS:  Please see the history of present illness.   Otherwise, review of systems are positive for none.   All other systems are reviewed and negative.    PHYSICAL EXAM: VS:  Pulse (!) 51   Temp (!) 97.3 F (36.3 C) (Temporal)   Ht 5\' 6"  (1.676 m)   Wt 125 lb (56.7 kg)   SpO2 99%   BMI 20.18 kg/m  , BMI Body mass index is 20.18 kg/m. GENERAL:  Well appearing NECK:  No  jugular venous distention, waveform within normal limits, carotid upstroke brisk and symmetric, no bruits, no thyromegaly LUNGS:  Clear to auscultation bilaterally CHEST:  Unremarkable HEART:  PMI not displaced or sustained,S1 and S2 within normal limits, no S3, no S4, no clicks, no rubs, no murmurs ABD:  Flat, positive bowel sounds normal in frequency in pitch, no bruits, no rebound, no guarding, no midline pulsatile mass, no hepatomegaly, no splenomegaly EXT:  2 plus pulses throughout, no edema, no cyanosis no clubbing   EKG:  EKG is not ordered today.    Recent Labs: 10/31/2018: ALT 23; B Natriuretic Peptide 13.5; BUN 14; Creatinine, Ser 0.71; Hemoglobin 14.2; Platelets 267; Potassium 3.7; Sodium 137    Lipid Panel    Component Value Date/Time   CHOL 223 (H) 07/21/2014 1006   TRIG 104 07/21/2014 1006    HDL 71 07/21/2014 1006   CHOLHDL 3.1 07/21/2014 1006   VLDL 21 07/21/2014 1006   LDLCALC 131 (H) 07/21/2014 1006      Wt Readings from Last 3 Encounters:  08/08/19 125 lb (56.7 kg)  06/15/19 130 lb (59 kg)  11/06/18 126 lb 3.2 oz (57.2 kg)      Other studies Reviewed: Additional studies/ records that were reviewed today include: None. Review of the above records demonstrates:  Please see elsewhere in the note.     ASSESSMENT AND PLAN:  Atypical chest pain:    She had an excellent exercise tolerance negative treadmill.  No further work-up is suggested.  Ascending aortic and aneurysm:   This was stable last fall.  I will probably follow this up with CT angiography next year.  History of bradycardia:   She is not having any symptomatic bradycardia.  No change in therapy.  Palpitation:    She had no sustained arrhythmia on Holter.  She is not noticing the palpitations unless she has significant stress.  No change in therapy.   HTN: She is having spikes of her blood pressure only when she is emotionally stressed on talking about avoiding this.  I do think she needs hydralazine 10 mg as needed in case she sustains a blood pressure readings in the 190/100 or so.  We had a discussion about this.  She is going to keep a blood pressure diary.  Current medicines are reviewed at length with the patient today.  The patient does not have concerns regarding medicines.  The following changes have been made:  As above  Labs/ tests ordered today include: None No orders of the defined types were placed in this encounter.    Disposition:   FU with me in one year.     Signed, Rollene RotundaJames Zaeda Mcferran, MD  08/08/2019 2:36 PM    [facility]

## 2019-08-08 ENCOUNTER — Encounter: Payer: Self-pay | Admitting: Cardiology

## 2019-08-08 ENCOUNTER — Other Ambulatory Visit: Payer: Self-pay

## 2019-08-08 ENCOUNTER — Ambulatory Visit: Payer: BC Managed Care – PPO | Admitting: Cardiology

## 2019-08-08 VITALS — BP 140/80 | HR 51 | Temp 97.3°F | Ht 66.0 in | Wt 125.0 lb

## 2019-08-08 DIAGNOSIS — I7121 Aneurysm of the ascending aorta, without rupture: Secondary | ICD-10-CM

## 2019-08-08 DIAGNOSIS — L659 Nonscarring hair loss, unspecified: Secondary | ICD-10-CM | POA: Diagnosis not present

## 2019-08-08 DIAGNOSIS — R002 Palpitations: Secondary | ICD-10-CM | POA: Diagnosis not present

## 2019-08-08 DIAGNOSIS — R072 Precordial pain: Secondary | ICD-10-CM

## 2019-08-08 DIAGNOSIS — I712 Thoracic aortic aneurysm, without rupture: Secondary | ICD-10-CM

## 2019-08-08 DIAGNOSIS — F329 Major depressive disorder, single episode, unspecified: Secondary | ICD-10-CM | POA: Diagnosis not present

## 2019-08-08 DIAGNOSIS — K900 Celiac disease: Secondary | ICD-10-CM | POA: Diagnosis not present

## 2019-08-08 DIAGNOSIS — E039 Hypothyroidism, unspecified: Secondary | ICD-10-CM | POA: Diagnosis not present

## 2019-08-08 DIAGNOSIS — Z23 Encounter for immunization: Secondary | ICD-10-CM | POA: Diagnosis not present

## 2019-08-08 MED ORDER — HYDRALAZINE HCL 10 MG PO TABS: ORAL_TABLET | ORAL | 5 refills | Status: DC

## 2019-08-08 NOTE — Patient Instructions (Signed)
Medication Instructions:  START HYDRALAZINE 10 MG AS NEEDED FOR ELEVATED BLOOD PRESSURE   If you need a refill on your cardiac medications before your next appointment, please call your pharmacy.   Lab work: NONE  Testing/Procedures: NONE  Follow-Up: At [facility], you and your health needs are our priority.  As part of our continuing mission to provide you with exceptional heart care, we have created designated Provider Care Teams.  These Care Teams include your primary Cardiologist (physician) and Advanced Practice Providers (APPs -  Physician Assistants and Nurse Practitioners) who all work together to provide you with the care you need, when you need it. You will need a follow up appointment in 12 months.  Please call our office 2 months in advance to schedule this appointment.  You may see DR [facility]  or one of the following Advanced Practice Providers on your designated Care Team:   Rosaria Ferries, PA-C . Jory Sims, DNP, ANP

## 2019-09-20 DIAGNOSIS — F410 Panic disorder [episodic paroxysmal anxiety] without agoraphobia: Secondary | ICD-10-CM | POA: Diagnosis not present

## 2019-10-29 ENCOUNTER — Other Ambulatory Visit: Payer: Self-pay | Admitting: Obstetrics and Gynecology

## 2019-10-29 ENCOUNTER — Other Ambulatory Visit (HOSPITAL_COMMUNITY)
Admission: RE | Admit: 2019-10-29 | Discharge: 2019-10-29 | Disposition: A | Discharge status: Home / Self Care | Payer: BC Managed Care – PPO | Source: Ambulatory Visit | Attending: Obstetrics and Gynecology | Admitting: Obstetrics and Gynecology

## 2019-10-29 DIAGNOSIS — Z113 Encounter for screening for infections with a predominantly sexual mode of transmission: Secondary | ICD-10-CM | POA: Diagnosis not present

## 2019-10-29 DIAGNOSIS — Z01419 Encounter for gynecological examination (general) (routine) without abnormal findings: Secondary | ICD-10-CM | POA: Insufficient documentation

## 2019-11-04 LAB — CYTOLOGY - PAP
Comment: NEGATIVE
Diagnosis: NEGATIVE
High risk HPV: NEGATIVE

## 2019-12-01 DIAGNOSIS — R519 Headache, unspecified: Secondary | ICD-10-CM | POA: Diagnosis not present

## 2019-12-01 DIAGNOSIS — R0981 Nasal congestion: Secondary | ICD-10-CM | POA: Diagnosis not present

## 2019-12-01 DIAGNOSIS — J019 Acute sinusitis, unspecified: Secondary | ICD-10-CM | POA: Diagnosis not present

## 2019-12-03 DIAGNOSIS — Z20828 Contact with and (suspected) exposure to other viral communicable diseases: Secondary | ICD-10-CM | POA: Diagnosis not present

## 2019-12-03 DIAGNOSIS — Z03818 Encounter for observation for suspected exposure to other biological agents ruled out: Secondary | ICD-10-CM | POA: Diagnosis not present

## 2020-02-07 ENCOUNTER — Ambulatory Visit (INDEPENDENT_AMBULATORY_CARE_PROVIDER_SITE_OTHER): Payer: BC Managed Care – PPO | Admitting: Otolaryngology

## 2020-02-15 DIAGNOSIS — Z20828 Contact with and (suspected) exposure to other viral communicable diseases: Secondary | ICD-10-CM | POA: Diagnosis not present

## 2020-02-15 DIAGNOSIS — Z03818 Encounter for observation for suspected exposure to other biological agents ruled out: Secondary | ICD-10-CM | POA: Diagnosis not present

## 2020-02-15 DIAGNOSIS — R5383 Other fatigue: Secondary | ICD-10-CM | POA: Diagnosis not present

## 2020-02-15 DIAGNOSIS — R519 Headache, unspecified: Secondary | ICD-10-CM | POA: Diagnosis not present

## 2020-06-22 DIAGNOSIS — Z1231 Encounter for screening mammogram for malignant neoplasm of breast: Secondary | ICD-10-CM | POA: Diagnosis not present

## 2020-06-22 DIAGNOSIS — M8589 Other specified disorders of bone density and structure, multiple sites: Secondary | ICD-10-CM | POA: Diagnosis not present

## 2020-07-02 DIAGNOSIS — Z20822 Contact with and (suspected) exposure to covid-19: Secondary | ICD-10-CM | POA: Diagnosis not present

## 2020-07-02 DIAGNOSIS — Z03818 Encounter for observation for suspected exposure to other biological agents ruled out: Secondary | ICD-10-CM | POA: Diagnosis not present

## 2020-07-09 DIAGNOSIS — F419 Anxiety disorder, unspecified: Secondary | ICD-10-CM | POA: Diagnosis not present

## 2020-07-09 DIAGNOSIS — M5430 Sciatica, unspecified side: Secondary | ICD-10-CM | POA: Diagnosis not present

## 2020-07-09 DIAGNOSIS — E039 Hypothyroidism, unspecified: Secondary | ICD-10-CM | POA: Diagnosis not present

## 2020-08-17 DIAGNOSIS — Z7189 Other specified counseling: Secondary | ICD-10-CM | POA: Insufficient documentation

## 2020-08-17 DIAGNOSIS — R0789 Other chest pain: Secondary | ICD-10-CM | POA: Insufficient documentation

## 2020-08-17 DIAGNOSIS — R002 Palpitations: Secondary | ICD-10-CM | POA: Insufficient documentation

## 2020-08-17 DIAGNOSIS — I7121 Aneurysm of the ascending aorta, without rupture: Secondary | ICD-10-CM

## 2020-08-17 DIAGNOSIS — R001 Bradycardia, unspecified: Secondary | ICD-10-CM

## 2020-08-17 DIAGNOSIS — I712 Thoracic aortic aneurysm, without rupture: Secondary | ICD-10-CM | POA: Insufficient documentation

## 2020-08-17 DIAGNOSIS — I10 Essential (primary) hypertension: Secondary | ICD-10-CM | POA: Insufficient documentation

## 2020-08-17 HISTORY — DX: Other chest pain: R07.89

## 2020-08-17 HISTORY — DX: Bradycardia, unspecified: R00.1

## 2020-08-17 HISTORY — DX: Aneurysm of the ascending aorta, without rupture: I71.21

## 2020-08-17 HISTORY — DX: Essential (primary) hypertension: I10

## 2020-08-17 HISTORY — DX: Other specified counseling: Z71.89

## 2020-08-17 HISTORY — DX: Palpitations: R00.2

## 2020-08-17 NOTE — Progress Notes (Signed)
Cardiology Office Note   Date:  08/18/2020   ID:  Jane, [name] February 17, 1956, MRN [id number]  PCP:  Jane Floro, MD  Cardiologist:   No primary care provider on file.   Chief Complaint  Patient presents with  . Ascending aortic aneurysm      History of Present Illness: [name] is a 64 y.o. female who presents for follow up of chest pain.  She had a CT cardiac score in August 2015 that showed calcium score of 100 which placed her at 78 percentile for age and sex matched control, she also had mildly dilated aortic root measuring 42 mm. She had a cardiac catheterization in 2015 that demonstrated no coronary artery disease but there was some calcification proximally in the LAD. Echocardiogram obtained on 07/13/2014 showed EF 55 to 60%, theechocardiogram did not demonstrate any aortic root enlargement. She also has a history of palpitation.  In 2019 she had chest pain and palpitations and she had a low risk stress test.  She had a stable CT aorta of the chest.  She had rare atrial tach on Holter.     Since I last saw her she has done well.  The patient denies any new symptoms such as chest discomfort, neck or arm discomfort. There has been no new shortness of breath, PND or orthopnea. There have been no reported palpitations, presyncope or syncope.    She has retired so she has less stress.  She has been exercising.  She does occasionally feel palpitations.  She has emotional stress and she thinks it is more related to that.  She has occasional chest discomfort that she thinks is related to heartburn and does not really think it is changed since her stress test.  Of note she has a son with congenital heart disease who is an adult and doing well.  I saw him briefly and he had a difficult course living in [place].  He is living in [place] and doing well and this is a significant stress reliever.  Past Medical History:  Diagnosis Date  . Thyroid disease     Past  Surgical History:  Procedure Laterality Date  . AIKEN OSTEOTOMY    . APPENDECTOMY    . CORONARY ANGIOGRAM  07/22/14   normal cors  . HAMMER TOE SURGERY    . LAPAROSCOPIC OOPHERECTOMY    . LEFT HEART CATHETERIZATION WITH CORONARY ANGIOGRAM N/A 07/22/2014   Procedure: LEFT HEART CATHETERIZATION WITH CORONARY ANGIOGRAM;  Surgeon: Lennette Bihari, MD;  Location: [facility] CATH LAB;  Service: Cardiovascular;  Laterality: N/A;  . METATARSAL OSTEOTOMY WITH BUNIONECTOMY    . NASAL SINUS SURGERY    . NEURECTOMY FOOT    . TUBAL LIGATION    . TUBAL LIGATION       Current Outpatient Medications  Medication Sig Dispense Refill  . folic acid (FOLVITE) 1 MG tablet Take 25 mg by mouth daily.    Marland Kitchen ALPRAZolam (XANAX) 0.25 MG tablet Take 0.25 mg by mouth 2 (two) times daily as needed.    Marland Kitchen b complex vitamins capsule Take 1 capsule by mouth daily.    . Cholecalciferol (VITAMIN D3) 125 MCG (5000 UT) CAPS Take 1 capsule by mouth daily.    . Fish Oil-Cholecalciferol (FISH OIL + D3 PO) Take 1 capsule by mouth daily.    . hydrALAZINE (APRESOLINE) 10 MG tablet TAKE AS NEEDED FOR ELEVATED BLOOD PRESSURE 30 tablet 5  . levothyroxine (SYNTHROID) 25 MCG  tablet Take 25 mcg by mouth every morning.    . Omega-3 Fatty Acids (FISH OIL) 1000 MG CAPS Take by mouth.    . propranolol (INDERAL) 10 MG tablet Take 1 tablet (10 mg total) by mouth every 8 (eight) hours as needed (palpitations). 90 tablet 1   No current facility-administered medications for this visit.    Allergies:   Sulfa antibiotics and Tetanus toxoids    ROS:  Please see the history of present illness.   Otherwise, review of systems are positive for none.   All other systems are reviewed and negative.    PHYSICAL EXAM: VS:  BP 120/90   Pulse (!) 47   Ht 5\' 6"  (1.676 m)   Wt 128 lb (58.1 kg)   BMI 20.66 kg/m  , BMI Body mass index is 20.66 kg/m. GENERAL:  Well appearing NECK:  No jugular venous distention, waveform within normal limits, carotid upstroke  brisk and symmetric, no bruits, no thyromegaly LUNGS:  Clear to auscultation bilaterally CHEST:  Unremarkable HEART:  PMI not displaced or sustained,S1 and S2 within normal limits, no S3, no S4, no clicks, no rubs, no murmurs ABD:  Flat, positive bowel sounds normal in frequency in pitch, no bruits, no rebound, no guarding, no midline pulsatile mass, no hepatomegaly, no splenomegaly EXT:  2 plus pulses throughout, no edema, no cyanosis no clubbing   EKG:  EKG is ordered today. Sinus bradycardia, rate 47, axis within normal limits, intervals within normal limits, no acute ST-T wave changes.   Recent Labs: No results found for requested labs within last 8760 hours.    Lipid Panel    Component Value Date/Time   CHOL 223 (H) 07/21/2014 1006   TRIG 104 07/21/2014 1006   HDL 71 07/21/2014 1006   CHOLHDL 3.1 07/21/2014 1006   VLDL 21 07/21/2014 1006   LDLCALC 131 (H) 07/21/2014 1006      Wt Readings from Last 3 Encounters:  08/18/20 128 lb (58.1 kg)  08/08/19 125 lb (56.7 kg)  06/15/19 130 lb (59 kg)      Other studies Reviewed: Additional studies/ records that were reviewed today include:  None. Review of the above records demonstrates:  Please see elsewhere in the note.     ASSESSMENT AND PLAN:  Atypical chest pain:     The patient's had no change in her atypical chest pain pattern since her 2019 stress test.  We will continue with risk reduction given her elevated coronary calcium.   Ascending aortic and aneurysm:   This was stable in Nov 2019.  I will order another one this year.   Palpitation:   She does have some tachypalpitations.  Despite her bradycardia I will give her a very low dose of propranolol perhaps 5 mg 3 times daily as needed.  HTN: Her blood pressure slightly elevated today but she says at home it is very well controlled.  No change in therapy.  Dyslipidemia: I will check a lipid profile.  She does not want to take statin but she might agree to this  pending that result.  We also talked about diet.  Covid education: She has been vaccinated.  Current medicines are reviewed at length with the patient today.  The patient does not have concerns regarding medicines.  The following changes have been made: As above  Labs/ tests ordered today include:   Orders Placed This Encounter  Procedures  . CT ANGIO CHEST AORTA W/CM & OR WO/CM  . Lipid panel  .  Basic metabolic panel  . EKG 12-Lead     Disposition:   FU with me in 1 year.     Signed, Rollene Rotunda, MD  08/18/2020 2:38 PM    [facility]

## 2020-08-18 ENCOUNTER — Ambulatory Visit (INDEPENDENT_AMBULATORY_CARE_PROVIDER_SITE_OTHER): Payer: BC Managed Care – PPO | Admitting: Cardiology

## 2020-08-18 ENCOUNTER — Encounter: Payer: Self-pay | Admitting: Cardiology

## 2020-08-18 ENCOUNTER — Other Ambulatory Visit: Payer: Self-pay

## 2020-08-18 VITALS — BP 120/90 | HR 47 | Ht 66.0 in | Wt 128.0 lb

## 2020-08-18 DIAGNOSIS — R002 Palpitations: Secondary | ICD-10-CM | POA: Diagnosis not present

## 2020-08-18 DIAGNOSIS — R0789 Other chest pain: Secondary | ICD-10-CM | POA: Diagnosis not present

## 2020-08-18 DIAGNOSIS — I10 Essential (primary) hypertension: Secondary | ICD-10-CM

## 2020-08-18 DIAGNOSIS — I7121 Aneurysm of the ascending aorta, without rupture: Secondary | ICD-10-CM

## 2020-08-18 DIAGNOSIS — Z7189 Other specified counseling: Secondary | ICD-10-CM

## 2020-08-18 DIAGNOSIS — I712 Thoracic aortic aneurysm, without rupture: Secondary | ICD-10-CM | POA: Diagnosis not present

## 2020-08-18 DIAGNOSIS — R001 Bradycardia, unspecified: Secondary | ICD-10-CM

## 2020-08-18 DIAGNOSIS — E039 Hypothyroidism, unspecified: Secondary | ICD-10-CM | POA: Diagnosis not present

## 2020-08-18 MED ORDER — PROPRANOLOL HCL 10 MG PO TABS: 10.0000 mg | ORAL_TABLET | Freq: Three times a day (TID) | ORAL | 1 refills | Status: DC | PRN

## 2020-08-18 NOTE — Patient Instructions (Signed)
Medication Instructions:  Start Propranolol 10 mg every 8 hours as needed for palpitations.  *If you need a refill on your cardiac medications before your next appointment, please call your pharmacy*   Lab Work: LIPID today   If you have labs (blood work) drawn today and your tests are completely normal, you will receive your results only by: Marland Kitchen MyChart Message (if you have MyChart) OR . A paper copy in the mail If you have any lab test that is abnormal or we need to change your treatment, we will call you to review the results.   Testing/Procedures: CT ANGIO AORTA    Follow-Up: At [facility], you and your health needs are our priority.  As part of our continuing mission to provide you with exceptional heart care, we have created designated Provider Care Teams.  These Care Teams include your primary Cardiologist (physician) and Advanced Practice Providers (APPs -  Physician Assistants and Nurse Practitioners) who all work together to provide you with the care you need, when you need it.  We recommend signing up for the patient portal called "MyChart".  Sign up information is provided on this After Visit Summary.  MyChart is used to connect with patients for Virtual Visits (Telemedicine).  Patients are able to view lab/test results, encounter notes, upcoming appointments, etc.  Non-urgent messages can be sent to your provider as well.   To learn more about what you can do with MyChart, go to [email or web address].    Your next appointment:   12 month(s)  The format for your next appointment:   In Person  Provider:   Rollene Rotunda, MD

## 2020-08-20 DIAGNOSIS — E785 Hyperlipidemia, unspecified: Secondary | ICD-10-CM

## 2020-08-20 DIAGNOSIS — M545 Low back pain: Secondary | ICD-10-CM | POA: Diagnosis not present

## 2020-08-20 DIAGNOSIS — M5441 Lumbago with sciatica, right side: Secondary | ICD-10-CM | POA: Diagnosis not present

## 2020-08-20 DIAGNOSIS — R0789 Other chest pain: Secondary | ICD-10-CM | POA: Diagnosis not present

## 2020-08-20 DIAGNOSIS — I2510 Atherosclerotic heart disease of native coronary artery without angina pectoris: Secondary | ICD-10-CM

## 2020-08-20 DIAGNOSIS — I712 Thoracic aortic aneurysm, without rupture: Secondary | ICD-10-CM | POA: Diagnosis not present

## 2020-08-20 DIAGNOSIS — M5442 Lumbago with sciatica, left side: Secondary | ICD-10-CM | POA: Diagnosis not present

## 2020-08-20 LAB — LIPID PANEL
Chol/HDL Ratio: 2.7 ratio (ref 0.0–4.4)
Cholesterol, Total: 235 mg/dL — ABNORMAL HIGH (ref 100–199)
HDL: 88 mg/dL (ref >39)
LDL Chol Calc (NIH): 137 mg/dL — ABNORMAL HIGH (ref 0–99)
Triglycerides: 62 mg/dL (ref 0–149)
VLDL Cholesterol Cal: 10 mg/dL (ref 5–40)

## 2020-08-20 LAB — BASIC METABOLIC PANEL
BUN/Creatinine Ratio: 22 (ref 12–28)
BUN: 16 mg/dL (ref 8–27)
CO2: 20 mmol/L (ref 20–29)
Calcium: 9.9 mg/dL (ref 8.7–10.3)
Chloride: 103 mmol/L (ref 96–106)
Creatinine, Ser: 0.74 mg/dL (ref 0.57–1.00)
GFR calc Af Amer: 100 mL/min/{1.73_m2} (ref >59)
GFR calc non Af Amer: 86 mL/min/{1.73_m2} (ref >59)
Glucose: 78 mg/dL (ref 65–99)
Potassium: 4.2 mmol/L (ref 3.5–5.2)
Sodium: 140 mmol/L (ref 134–144)

## 2020-08-24 ENCOUNTER — Other Ambulatory Visit: Payer: Self-pay | Admitting: *Deleted

## 2020-08-24 DIAGNOSIS — E785 Hyperlipidemia, unspecified: Secondary | ICD-10-CM

## 2020-08-24 DIAGNOSIS — I2510 Atherosclerotic heart disease of native coronary artery without angina pectoris: Secondary | ICD-10-CM

## 2020-08-24 MED ORDER — ATORVASTATIN CALCIUM 20 MG PO TABS: 20.0000 mg | ORAL_TABLET | Freq: Every day | ORAL | 3 refills | Status: DC

## 2020-08-24 NOTE — Telephone Encounter (Signed)
I spoke with patient. She would like to start Lipitor.  Will send prescription to [facility] on [address]. Patient will come in for fasting lab work on November 22,2021.

## 2020-08-24 NOTE — Telephone Encounter (Signed)
I placed a call to patient and left message to call office.  Message also sent to patient through my chart.  Results routed to Dr Tenny Craw.

## 2020-08-25 ENCOUNTER — Other Ambulatory Visit: Payer: BC Managed Care – PPO

## 2020-08-26 ENCOUNTER — Other Ambulatory Visit: Payer: Self-pay | Admitting: *Deleted

## 2020-08-26 DIAGNOSIS — E785 Hyperlipidemia, unspecified: Secondary | ICD-10-CM

## 2020-09-01 ENCOUNTER — Ambulatory Visit (INDEPENDENT_AMBULATORY_CARE_PROVIDER_SITE_OTHER)
Admission: RE | Admit: 2020-09-01 | Discharge: 2020-09-01 | Disposition: A | Discharge status: Home / Self Care | Payer: BC Managed Care – PPO | Source: Ambulatory Visit | Attending: Cardiology | Admitting: Cardiology

## 2020-09-01 ENCOUNTER — Other Ambulatory Visit: Payer: Self-pay

## 2020-09-01 DIAGNOSIS — J811 Chronic pulmonary edema: Secondary | ICD-10-CM | POA: Diagnosis not present

## 2020-09-01 DIAGNOSIS — I712 Thoracic aortic aneurysm, without rupture: Secondary | ICD-10-CM

## 2020-09-01 DIAGNOSIS — I7121 Aneurysm of the ascending aorta, without rupture: Secondary | ICD-10-CM

## 2020-09-01 DIAGNOSIS — J984 Other disorders of lung: Secondary | ICD-10-CM | POA: Diagnosis not present

## 2020-09-01 MED ORDER — IOHEXOL 350 MG/ML SOLN
100.0000 mL | Freq: Once | INTRAVENOUS | Status: AC | PRN
  Administered 2020-09-01: 100 mL via INTRAVENOUS

## 2020-09-02 ENCOUNTER — Telehealth: Payer: Self-pay | Admitting: *Deleted

## 2020-09-02 DIAGNOSIS — I7121 Aneurysm of the ascending aorta, without rupture: Secondary | ICD-10-CM

## 2020-09-02 DIAGNOSIS — I712 Thoracic aortic aneurysm, without rupture, unspecified: Secondary | ICD-10-CM

## 2020-09-02 DIAGNOSIS — F321 Major depressive disorder, single episode, moderate: Secondary | ICD-10-CM | POA: Diagnosis not present

## 2020-09-02 DIAGNOSIS — F4323 Adjustment disorder with mixed anxiety and depressed mood: Secondary | ICD-10-CM | POA: Diagnosis not present

## 2020-09-02 NOTE — Telephone Encounter (Signed)
-----   Message from Rollene Rotunda, MD sent at 09/01/2020  6:25 PM EDT ----- Very mild increase in aortic size compared with previous CT.  Please make sure she comes back to see me in in one year and order a repeat CT of the ascending aorta with contrast in one year.  Call Ms. Gatling with the results and send results to Daisy Floro, MD

## 2020-09-02 NOTE — Telephone Encounter (Signed)
Released in my chart with Dr Hochrein's comments attached and sent to PCP  Future order placed and recall for follow up in Epic

## 2020-09-09 DIAGNOSIS — F4323 Adjustment disorder with mixed anxiety and depressed mood: Secondary | ICD-10-CM | POA: Diagnosis not present

## 2020-09-11 ENCOUNTER — Other Ambulatory Visit: Payer: Self-pay | Admitting: Cardiology

## 2020-09-17 DIAGNOSIS — F4323 Adjustment disorder with mixed anxiety and depressed mood: Secondary | ICD-10-CM | POA: Diagnosis not present

## 2020-09-22 DIAGNOSIS — M545 Low back pain, unspecified: Secondary | ICD-10-CM | POA: Diagnosis not present

## 2020-09-22 DIAGNOSIS — M5441 Lumbago with sciatica, right side: Secondary | ICD-10-CM | POA: Diagnosis not present

## 2020-10-01 DIAGNOSIS — F4323 Adjustment disorder with mixed anxiety and depressed mood: Secondary | ICD-10-CM | POA: Diagnosis not present

## 2020-10-02 DIAGNOSIS — Z20822 Contact with and (suspected) exposure to covid-19: Secondary | ICD-10-CM | POA: Diagnosis not present

## 2020-10-22 DIAGNOSIS — F4323 Adjustment disorder with mixed anxiety and depressed mood: Secondary | ICD-10-CM | POA: Diagnosis not present

## 2020-11-02 DIAGNOSIS — M545 Low back pain, unspecified: Secondary | ICD-10-CM | POA: Diagnosis not present

## 2020-11-02 DIAGNOSIS — M5442 Lumbago with sciatica, left side: Secondary | ICD-10-CM | POA: Diagnosis not present

## 2020-11-09 DIAGNOSIS — I2510 Atherosclerotic heart disease of native coronary artery without angina pectoris: Secondary | ICD-10-CM | POA: Diagnosis not present

## 2020-11-09 DIAGNOSIS — I2584 Coronary atherosclerosis due to calcified coronary lesion: Secondary | ICD-10-CM | POA: Diagnosis not present

## 2020-11-09 DIAGNOSIS — E785 Hyperlipidemia, unspecified: Secondary | ICD-10-CM | POA: Diagnosis not present

## 2020-11-09 LAB — LIPID PANEL
Chol/HDL Ratio: 2.1 ratio (ref 0.0–4.4)
Cholesterol, Total: 190 mg/dL (ref 100–199)
HDL: 91 mg/dL (ref >39)
LDL Chol Calc (NIH): 87 mg/dL (ref 0–99)
Triglycerides: 65 mg/dL (ref 0–149)
VLDL Cholesterol Cal: 12 mg/dL (ref 5–40)

## 2020-11-09 LAB — HEPATIC FUNCTION PANEL
ALT: 20 IU/L (ref 0–32)
AST: 25 IU/L (ref 0–40)
Albumin: 4.8 g/dL (ref 3.8–4.8)
Alkaline Phosphatase: 81 IU/L (ref 44–121)
Bilirubin Total: 0.6 mg/dL (ref 0.0–1.2)
Bilirubin, Direct: 0.14 mg/dL (ref 0.00–0.40)
Total Protein: 6.9 g/dL (ref 6.0–8.5)

## 2020-11-10 ENCOUNTER — Other Ambulatory Visit: Payer: Self-pay | Admitting: Orthopedic Surgery

## 2020-11-10 DIAGNOSIS — M6281 Muscle weakness (generalized): Secondary | ICD-10-CM | POA: Diagnosis not present

## 2020-11-10 DIAGNOSIS — M545 Low back pain, unspecified: Secondary | ICD-10-CM

## 2020-11-10 DIAGNOSIS — S336XXD Sprain of sacroiliac joint, subsequent encounter: Secondary | ICD-10-CM | POA: Diagnosis not present

## 2020-11-19 DIAGNOSIS — H43812 Vitreous degeneration, left eye: Secondary | ICD-10-CM | POA: Diagnosis not present

## 2020-11-23 DIAGNOSIS — E039 Hypothyroidism, unspecified: Secondary | ICD-10-CM | POA: Diagnosis not present

## 2020-12-14 DIAGNOSIS — F4323 Adjustment disorder with mixed anxiety and depressed mood: Secondary | ICD-10-CM | POA: Diagnosis not present

## 2020-12-15 ENCOUNTER — Other Ambulatory Visit: Payer: BC Managed Care – PPO

## 2020-12-23 ENCOUNTER — Ambulatory Visit: Payer: BC Managed Care – PPO | Admitting: Podiatry

## 2020-12-23 ENCOUNTER — Other Ambulatory Visit: Payer: Self-pay

## 2020-12-23 ENCOUNTER — Ambulatory Visit (INDEPENDENT_AMBULATORY_CARE_PROVIDER_SITE_OTHER): Payer: BC Managed Care – PPO

## 2020-12-23 DIAGNOSIS — M79671 Pain in right foot: Secondary | ICD-10-CM

## 2020-12-23 DIAGNOSIS — Z472 Encounter for removal of internal fixation device: Secondary | ICD-10-CM | POA: Diagnosis not present

## 2020-12-23 DIAGNOSIS — M2041 Other hammer toe(s) (acquired), right foot: Secondary | ICD-10-CM | POA: Diagnosis not present

## 2020-12-23 NOTE — Progress Notes (Signed)
Subjective:   Patient ID: [name], female   DOB: 65 y.o.   MRN: [id number]   HPI Patient presents stating that the top of the right foot has been bothering her the fifth digit bothers her and she is got a bad corn on the fourth digit right.  States that she did have metal taken out but 1 was left in and she thinks she may have irritation from that area.  Patient does not smoke likes to be active   Review of Systems  All other systems reviewed and are negative.       Objective:  Physical Exam Vitals and nursing note reviewed.  Constitutional:      Appearance: She is well-developed and well-nourished.  Cardiovascular:     Pulses: Intact distal pulses.  Pulmonary:     Effort: Pulmonary effort is normal.  Musculoskeletal:        General: Normal range of motion.  Skin:    General: Skin is warm.  Neurological:     Mental Status: She is alert.     Neurovascular status intact muscle strength adequate range of motion adequate.  Patient was found to have what appears to be some prominence around the distal pin right first metatarsal that is irritated with possibility for low-grade bony irritation but appears to be mostly related to the pin and has a relatively normal appearance of the lesser digits with a keratotic lesion distal fourth and a nondescript pain fifth digit that she has trouble describing what it is but states at times it is sore.  Patient has good digital perfusion well oriented     Assessment:  Possibility that this may be a abnormal pin position right first metatarsal with history of metal allergies along with digital deformity with keratotic lesion right fourth digit with deformity of the fifth digit     Plan:  H&P reviewed all conditions.  At this point I do not see anything on the fifth toe but I did debride the lesion fourth toe and plantar fourth metatarsal and I discussed pin removal for the right first metatarsal which long-term offers a chance to help the  pain she has there.  She wants to get this done but needs to look at her schedule and I educated her on the deformity at the current time and reviewed with her explaining it may or may not solve     X-rays indicate that there is a pin distal first metatarsal overall the lesser digits look good good alignment is noted no indications of advanced arthritis

## 2020-12-30 DIAGNOSIS — C44519 Basal cell carcinoma of skin of other part of trunk: Secondary | ICD-10-CM | POA: Diagnosis not present

## 2020-12-30 DIAGNOSIS — Z85828 Personal history of other malignant neoplasm of skin: Secondary | ICD-10-CM | POA: Diagnosis not present

## 2020-12-30 DIAGNOSIS — D2261 Melanocytic nevi of right upper limb, including shoulder: Secondary | ICD-10-CM | POA: Diagnosis not present

## 2020-12-30 DIAGNOSIS — L819 Disorder of pigmentation, unspecified: Secondary | ICD-10-CM | POA: Diagnosis not present

## 2020-12-30 DIAGNOSIS — D225 Melanocytic nevi of trunk: Secondary | ICD-10-CM | POA: Diagnosis not present

## 2020-12-30 DIAGNOSIS — L570 Actinic keratosis: Secondary | ICD-10-CM | POA: Diagnosis not present

## 2021-01-15 ENCOUNTER — Ambulatory Visit
Admission: RE | Admit: 2021-01-15 | Discharge: 2021-01-15 | Disposition: A | Discharge status: Home / Self Care | Payer: BC Managed Care – PPO | Source: Ambulatory Visit | Attending: Orthopedic Surgery | Admitting: Orthopedic Surgery

## 2021-01-15 ENCOUNTER — Other Ambulatory Visit: Payer: Self-pay

## 2021-01-15 DIAGNOSIS — M545 Low back pain, unspecified: Secondary | ICD-10-CM | POA: Diagnosis not present

## 2021-02-03 DIAGNOSIS — R197 Diarrhea, unspecified: Secondary | ICD-10-CM | POA: Diagnosis not present

## 2021-02-03 DIAGNOSIS — M2550 Pain in unspecified joint: Secondary | ICD-10-CM | POA: Diagnosis not present

## 2021-02-16 DIAGNOSIS — M5441 Lumbago with sciatica, right side: Secondary | ICD-10-CM | POA: Diagnosis not present

## 2021-02-16 DIAGNOSIS — M545 Low back pain, unspecified: Secondary | ICD-10-CM | POA: Diagnosis not present

## 2021-02-16 DIAGNOSIS — M25511 Pain in right shoulder: Secondary | ICD-10-CM | POA: Diagnosis not present

## 2021-02-23 DIAGNOSIS — M533 Sacrococcygeal disorders, not elsewhere classified: Secondary | ICD-10-CM | POA: Diagnosis not present

## 2021-04-16 DIAGNOSIS — Z01419 Encounter for gynecological examination (general) (routine) without abnormal findings: Secondary | ICD-10-CM | POA: Diagnosis not present

## 2021-06-28 DIAGNOSIS — Z1231 Encounter for screening mammogram for malignant neoplasm of breast: Secondary | ICD-10-CM | POA: Diagnosis not present

## 2021-07-05 DIAGNOSIS — L820 Inflamed seborrheic keratosis: Secondary | ICD-10-CM | POA: Diagnosis not present

## 2021-07-05 DIAGNOSIS — L603 Nail dystrophy: Secondary | ICD-10-CM | POA: Diagnosis not present

## 2021-07-05 DIAGNOSIS — L570 Actinic keratosis: Secondary | ICD-10-CM | POA: Diagnosis not present

## 2021-07-05 DIAGNOSIS — Z85828 Personal history of other malignant neoplasm of skin: Secondary | ICD-10-CM | POA: Diagnosis not present

## 2021-08-18 ENCOUNTER — Inpatient Hospital Stay: Admission: RE | Admit: 2021-08-18 | Payer: BC Managed Care – PPO | Source: Ambulatory Visit

## 2021-08-19 ENCOUNTER — Ambulatory Visit
Admission: RE | Admit: 2021-08-19 | Discharge: 2021-08-19 | Disposition: A | Discharge status: Home / Self Care | Payer: BC Managed Care – PPO | Source: Ambulatory Visit | Attending: Cardiology | Admitting: Cardiology

## 2021-08-19 DIAGNOSIS — I712 Thoracic aortic aneurysm, without rupture, unspecified: Secondary | ICD-10-CM

## 2021-08-19 DIAGNOSIS — I7121 Aneurysm of the ascending aorta, without rupture: Secondary | ICD-10-CM

## 2021-08-19 DIAGNOSIS — I700 Atherosclerosis of aorta: Secondary | ICD-10-CM | POA: Diagnosis not present

## 2021-08-19 MED ORDER — IOPAMIDOL (ISOVUE-370) INJECTION 76%
75.0000 mL | Freq: Once | INTRAVENOUS | Status: AC | PRN
  Administered 2021-08-19: 75 mL via INTRAVENOUS

## 2021-08-21 NOTE — Progress Notes (Addendum)
Cardiology Office Note   Date:  08/23/2021   ID:  Gilberto, Streck 22-Sep-1956, MRN [id number]  PCP:  Daisy Floro, MD  Cardiologist:   None   Chief Complaint  Patient presents with   Abnormal ECG       History of Present Illness: [name] is a 65 y.o. female who presents for follow up of chest pain.   She had a CT cardiac score in August 2015 that showed calcium score of 100 which placed her at 25 percentile for age and sex matched control, she also had mildly dilated aortic root measuring 42 mm.  She had a cardiac catheterization in 2015 that demonstrated no coronary artery disease but there was some calcification proximally in the LAD.  Echocardiogram obtained on 07/13/2014 showed EF 55 to 60%, the echocardiogram did not demonstrate any aortic root enlargement.  She also has a history of palpitation.  In 2019 she had chest pain and palpitations and she had a low risk stress test.  She had a mildly increased size of her aorta on CT last year.   She had rare atrial tach on Holter.     Of note I looked back at her catheterization from 2015 and she had extraluminal coronary calcium.  She had an excellent exercise tolerance test in 2019.  She has chronic T wave inversions on her lateral and inferior leads on EKG.  She has had some fleeting chest discomfort.  This has been sporadic.  Its left upper chest.  5 out of 10.  Sharp and stabbing.  It comes and goes spontaneously.  She can do physical activity such as spin bike and she gets 16,000 steps a day without chest discomfort or bringing on the symptoms.  She has no associated nausea vomiting or diaphoresis.  He has no shortness of breath, PND or orthopnea.  She did stop taking Lipitor because she had shoulder aches.   Past Medical History:  Diagnosis Date   Thyroid disease     Past Surgical History:  Procedure Laterality Date   AIKEN OSTEOTOMY     APPENDECTOMY     CORONARY ANGIOGRAM  07/22/14   normal cors   HAMMER TOE  SURGERY     LAPAROSCOPIC OOPHERECTOMY     LEFT HEART CATHETERIZATION WITH CORONARY ANGIOGRAM N/A 07/22/2014   Procedure: LEFT HEART CATHETERIZATION WITH CORONARY ANGIOGRAM;  Surgeon: Lennette Bihari, MD;  Location: [facility] CATH LAB;  Service: Cardiovascular;  Laterality: N/A;   METATARSAL OSTEOTOMY WITH BUNIONECTOMY     NASAL SINUS SURGERY     NEURECTOMY FOOT     TUBAL LIGATION     TUBAL LIGATION       Current Outpatient Medications  Medication Sig Dispense Refill   ALPRAZolam (XANAX) 0.25 MG tablet Take 0.25 mg by mouth 2 (two) times daily as needed.     Cholecalciferol (VITAMIN D3) 125 MCG (5000 UT) CAPS Take 1 capsule by mouth daily.     Fish Oil-Cholecalciferol (FISH OIL + D3 PO) Take 1 capsule by mouth daily.     folic acid (FOLVITE) 1 MG tablet Take 25 mg by mouth daily.     levothyroxine (SYNTHROID) 50 MCG tablet Take 50 mcg by mouth daily.     Omega-3 Fatty Acids (FISH OIL) 1000 MG CAPS Take by mouth.     propranolol (INDERAL) 10 MG tablet Take 1 tablet (10 mg total) by mouth every 8 (eight) hours as needed (palpitations). 90 tablet 1  No current facility-administered medications for this visit.    Allergies:   Sulfa antibiotics and Tetanus toxoids    ROS:  Please see the history of present illness.   Otherwise, review of systems are positive for none.   All other systems are reviewed and negative.    PHYSICAL EXAM: VS:  BP 122/74   Pulse (!) 50   Ht 5\' 5"  (1.651 m)   Wt 122 lb 3.2 oz (55.4 kg)   SpO2 99%   BMI 20.34 kg/m  , BMI Body mass index is 20.34 kg/m. GENERAL:  Well appearing NECK:  No jugular venous distention, waveform within normal limits, carotid upstroke brisk and symmetric, no bruits, no thyromegaly LUNGS:  Clear to auscultation bilaterally CHEST:  Unremarkable HEART:  PMI not displaced or sustained,S1 and S2 within normal limits, no S3, no S4, no clicks, no rubs, no murmurs ABD:  Flat, positive bowel sounds normal in frequency in pitch, no bruits, no  rebound, no guarding, no midline pulsatile mass, no hepatomegaly, no splenomegaly EXT:  2 plus pulses throughout, no edema, no cyanosis no clubbing   EKG:  EKG is  ordered today. Sinus bradycardia, rate 50, axis within normal limits, intervals within normal limits, no acute ST-T wave changes.  Inferolateral T wave inversions are unchanged from previous   Recent Labs: 11/09/2020: ALT 20    Lipid Panel    Component Value Date/Time   CHOL 190 11/09/2020 0902   TRIG 65 11/09/2020 0902   HDL 91 11/09/2020 0902   CHOLHDL 2.1 11/09/2020 0902   CHOLHDL 3.1 07/21/2014 1006   VLDL 21 07/21/2014 1006   LDLCALC 87 11/09/2020 0902      Wt Readings from Last 3 Encounters:  08/23/21 122 lb 3.2 oz (55.4 kg)  08/18/20 128 lb (58.1 kg)  08/08/19 125 lb (56.7 kg)      Other studies Reviewed: Additional studies/ records that were reviewed today include:  Labs. Review of the above records demonstrates:  Please see elsewhere in the note.     ASSESSMENT AND PLAN:  Atypical chest pain:     This is an unchanged pattern and she had a negative stress test in 2019 with no obstructive coronary disease previously.  I think the pretest probability of obstructive coronary disease is very low.  No change in therapy.   Ascending aortic and aneurysm:     This was 43 mm on the CT earlier this month.  I will follow this up again in 1 year.  HTN:   Blood pressures controlled.  She will continue the meds as listed.  Dyslipidemia:    She is off her statin.  I am going to check a lipid profile.  I would suggest that if her LDL is greater than 100 we might suggest pravastatin.  Current medicines are reviewed at length with the patient today.  The patient does not have concerns regarding medicines.  The following changes have been made: As above  Labs/ tests ordered today include:  None  Orders Placed This Encounter  Procedures   CT ANGIO CHEST AORTA W/CM & OR WO/CM   Lipid panel   EKG 12-Lead       Disposition:   FU with me in one year.     Signed, 2020, MD  08/23/2021 9:10 AM    [facility]

## 2021-08-23 ENCOUNTER — Other Ambulatory Visit: Payer: Self-pay

## 2021-08-23 ENCOUNTER — Ambulatory Visit: Payer: BC Managed Care – PPO | Admitting: Cardiology

## 2021-08-23 ENCOUNTER — Encounter: Payer: Self-pay | Admitting: Cardiology

## 2021-08-23 VITALS — BP 122/74 | HR 50 | Ht 65.0 in | Wt 122.2 lb

## 2021-08-23 DIAGNOSIS — R0789 Other chest pain: Secondary | ICD-10-CM

## 2021-08-23 DIAGNOSIS — I712 Thoracic aortic aneurysm, without rupture, unspecified: Secondary | ICD-10-CM

## 2021-08-23 DIAGNOSIS — I10 Essential (primary) hypertension: Secondary | ICD-10-CM

## 2021-08-23 DIAGNOSIS — E785 Hyperlipidemia, unspecified: Secondary | ICD-10-CM | POA: Diagnosis not present

## 2021-08-23 DIAGNOSIS — I7121 Aneurysm of the ascending aorta, without rupture: Secondary | ICD-10-CM

## 2021-08-23 LAB — LIPID PANEL
Chol/HDL Ratio: 2.8 ratio (ref 0.0–4.4)
Cholesterol, Total: 249 mg/dL — ABNORMAL HIGH (ref 100–199)
HDL: 88 mg/dL (ref >39)
LDL Chol Calc (NIH): 151 mg/dL — ABNORMAL HIGH (ref 0–99)
Triglycerides: 59 mg/dL (ref 0–149)
VLDL Cholesterol Cal: 10 mg/dL (ref 5–40)

## 2021-08-23 NOTE — Patient Instructions (Signed)
Medication Instructions:  The current medical regimen is effective;  continue present plan and medications.  *If you need a refill on your cardiac medications before your next appointment, please call your pharmacy*   Lab Work: LIPID  If you have labs (blood work) drawn today and your tests are completely normal, you will receive your results only by: MyChart Message (if you have MyChart) OR A paper copy in the mail If you have any lab test that is abnormal or we need to change your treatment, we will call you to review the results.   Testing/Procedures: CT AORTA in one year.    Follow-Up: At [facility], you and your health needs are our priority.  As part of our continuing mission to provide you with exceptional heart care, we have created designated Provider Care Teams.  These Care Teams include your primary Cardiologist (physician) and Advanced Practice Providers (APPs -  Physician Assistants and Nurse Practitioners) who all work together to provide you with the care you need, when you need it.  We recommend signing up for the patient portal called "MyChart".  Sign up information is provided on this After Visit Summary.  MyChart is used to connect with patients for Virtual Visits (Telemedicine).  Patients are able to view lab/test results, encounter notes, upcoming appointments, etc.  Non-urgent messages can be sent to your provider as well.   To learn more about what you can do with MyChart, go to [email or web address].    Your next appointment:   12 month(s)  The format for your next appointment:   In Person  Provider:   Rollene Rotunda, MD

## 2021-08-27 ENCOUNTER — Other Ambulatory Visit: Payer: Self-pay | Admitting: *Deleted

## 2021-08-27 DIAGNOSIS — E785 Hyperlipidemia, unspecified: Secondary | ICD-10-CM

## 2021-08-27 MED ORDER — PRAVASTATIN SODIUM 80 MG PO TABS: 80.0000 mg | ORAL_TABLET | Freq: Every evening | ORAL | 3 refills | Status: DC

## 2021-09-29 DIAGNOSIS — K2100 Gastro-esophageal reflux disease with esophagitis, without bleeding: Secondary | ICD-10-CM | POA: Diagnosis not present

## 2021-09-29 DIAGNOSIS — R1310 Dysphagia, unspecified: Secondary | ICD-10-CM | POA: Diagnosis not present

## 2021-09-29 DIAGNOSIS — Z86010 Personal history of colonic polyps: Secondary | ICD-10-CM | POA: Diagnosis not present

## 2021-10-06 DIAGNOSIS — K2100 Gastro-esophageal reflux disease with esophagitis, without bleeding: Secondary | ICD-10-CM | POA: Diagnosis not present

## 2021-10-06 DIAGNOSIS — Z13220 Encounter for screening for lipoid disorders: Secondary | ICD-10-CM | POA: Diagnosis not present

## 2021-10-06 DIAGNOSIS — E559 Vitamin D deficiency, unspecified: Secondary | ICD-10-CM | POA: Diagnosis not present

## 2021-10-06 DIAGNOSIS — Z0000 Encounter for general adult medical examination without abnormal findings: Secondary | ICD-10-CM | POA: Diagnosis not present

## 2021-10-06 DIAGNOSIS — F419 Anxiety disorder, unspecified: Secondary | ICD-10-CM | POA: Diagnosis not present

## 2021-10-06 DIAGNOSIS — E039 Hypothyroidism, unspecified: Secondary | ICD-10-CM | POA: Diagnosis not present

## 2021-10-11 DIAGNOSIS — H2513 Age-related nuclear cataract, bilateral: Secondary | ICD-10-CM | POA: Diagnosis not present

## 2021-10-11 DIAGNOSIS — H524 Presbyopia: Secondary | ICD-10-CM | POA: Diagnosis not present

## 2021-11-17 DIAGNOSIS — R509 Fever, unspecified: Secondary | ICD-10-CM | POA: Diagnosis not present

## 2021-11-17 DIAGNOSIS — R059 Cough, unspecified: Secondary | ICD-10-CM | POA: Diagnosis not present

## 2021-11-18 DIAGNOSIS — Z03818 Encounter for observation for suspected exposure to other biological agents ruled out: Secondary | ICD-10-CM | POA: Diagnosis not present

## 2021-11-18 DIAGNOSIS — J111 Influenza due to unidentified influenza virus with other respiratory manifestations: Secondary | ICD-10-CM | POA: Diagnosis not present

## 2021-11-18 DIAGNOSIS — R059 Cough, unspecified: Secondary | ICD-10-CM | POA: Diagnosis not present

## 2021-12-17 ENCOUNTER — Other Ambulatory Visit: Payer: Self-pay | Admitting: Cardiology

## 2022-01-12 ENCOUNTER — Other Ambulatory Visit: Payer: Self-pay | Admitting: Cardiology

## 2022-01-18 DIAGNOSIS — Z800 Family history of malignant neoplasm of digestive organs: Secondary | ICD-10-CM | POA: Diagnosis not present

## 2022-01-18 DIAGNOSIS — K649 Unspecified hemorrhoids: Secondary | ICD-10-CM | POA: Diagnosis not present

## 2022-01-18 DIAGNOSIS — K5730 Diverticulosis of large intestine without perforation or abscess without bleeding: Secondary | ICD-10-CM | POA: Diagnosis not present

## 2022-01-18 DIAGNOSIS — K219 Gastro-esophageal reflux disease without esophagitis: Secondary | ICD-10-CM | POA: Diagnosis not present

## 2022-01-18 DIAGNOSIS — R1310 Dysphagia, unspecified: Secondary | ICD-10-CM | POA: Diagnosis not present

## 2022-01-18 DIAGNOSIS — Z86010 Personal history of colonic polyps: Secondary | ICD-10-CM | POA: Diagnosis not present

## 2022-01-18 DIAGNOSIS — K449 Diaphragmatic hernia without obstruction or gangrene: Secondary | ICD-10-CM | POA: Diagnosis not present

## 2022-01-19 DIAGNOSIS — L821 Other seborrheic keratosis: Secondary | ICD-10-CM | POA: Diagnosis not present

## 2022-01-19 DIAGNOSIS — D225 Melanocytic nevi of trunk: Secondary | ICD-10-CM | POA: Diagnosis not present

## 2022-01-19 DIAGNOSIS — L814 Other melanin hyperpigmentation: Secondary | ICD-10-CM | POA: Diagnosis not present

## 2022-01-19 DIAGNOSIS — C44619 Basal cell carcinoma of skin of left upper limb, including shoulder: Secondary | ICD-10-CM | POA: Diagnosis not present

## 2022-01-19 DIAGNOSIS — C44719 Basal cell carcinoma of skin of left lower limb, including hip: Secondary | ICD-10-CM | POA: Diagnosis not present

## 2022-01-19 DIAGNOSIS — Z85828 Personal history of other malignant neoplasm of skin: Secondary | ICD-10-CM | POA: Diagnosis not present

## 2022-01-19 DIAGNOSIS — L570 Actinic keratosis: Secondary | ICD-10-CM | POA: Diagnosis not present

## 2022-03-25 ENCOUNTER — Encounter: Payer: BC Managed Care – PPO | Admitting: Sports Medicine

## 2022-06-15 DIAGNOSIS — J019 Acute sinusitis, unspecified: Secondary | ICD-10-CM | POA: Diagnosis not present

## 2022-07-04 DIAGNOSIS — H5203 Hypermetropia, bilateral: Secondary | ICD-10-CM | POA: Diagnosis not present

## 2022-07-04 DIAGNOSIS — H43812 Vitreous degeneration, left eye: Secondary | ICD-10-CM | POA: Diagnosis not present

## 2022-07-04 DIAGNOSIS — H2513 Age-related nuclear cataract, bilateral: Secondary | ICD-10-CM | POA: Diagnosis not present

## 2022-07-04 DIAGNOSIS — Z1231 Encounter for screening mammogram for malignant neoplasm of breast: Secondary | ICD-10-CM | POA: Diagnosis not present

## 2022-08-03 ENCOUNTER — Encounter: Payer: Self-pay | Admitting: Cardiology

## 2022-08-09 ENCOUNTER — Telehealth: Payer: Self-pay | Admitting: Cardiology

## 2022-08-09 ENCOUNTER — Other Ambulatory Visit: Payer: Self-pay

## 2022-08-09 DIAGNOSIS — I7121 Aneurysm of the ascending aorta, without rupture: Secondary | ICD-10-CM

## 2022-08-09 NOTE — Telephone Encounter (Signed)
Sheralyn Boatman with [facility]. She states the patient requested they call. She states the patient wants to do her CT in October and the order will expire by then. She requests the order be updated or a new one be put in. Once this is done she would like an in basket message sent to April Pait to let them know to reach out to the patient and schedule.

## 2022-08-09 NOTE — Telephone Encounter (Signed)
Called patient left message on personal voice mail new order has been placed for chest ct.I spoke to central scheduling and they advised to call back to schedule.

## 2022-08-09 NOTE — Telephone Encounter (Signed)
Patient states she has called 8 times to try and schedule a CT. She states she was transferred to [facility] and they told her the order is expired.

## 2022-08-18 ENCOUNTER — Ambulatory Visit: Payer: BC Managed Care – PPO | Admitting: Cardiology

## 2022-08-19 ENCOUNTER — Inpatient Hospital Stay: Admission: RE | Admit: 2022-08-19 | Payer: BC Managed Care – PPO | Source: Ambulatory Visit

## 2022-09-12 ENCOUNTER — Telehealth (HOSPITAL_COMMUNITY): Payer: Self-pay | Admitting: *Deleted

## 2022-09-12 NOTE — Telephone Encounter (Signed)
Attempted to call patient about canceling her upcoming cardiac imaging appointment. Left message on voicemail with name and callback number  Gordy Clement RN Navigator Cardiac [facility] [phone] Office [phone] Cell

## 2022-09-14 ENCOUNTER — Encounter (HOSPITAL_COMMUNITY): Payer: Self-pay

## 2022-09-14 ENCOUNTER — Ambulatory Visit (HOSPITAL_COMMUNITY): Admission: RE | Admit: 2022-09-14 | Payer: BC Managed Care – PPO | Source: Ambulatory Visit

## 2022-09-15 ENCOUNTER — Ambulatory Visit (HOSPITAL_COMMUNITY)
Admission: RE | Admit: 2022-09-15 | Discharge: 2022-09-15 | Disposition: A | Discharge status: Home / Self Care | Payer: BC Managed Care – PPO | Source: Ambulatory Visit | Attending: Cardiology | Admitting: Cardiology

## 2022-09-15 DIAGNOSIS — I712 Thoracic aortic aneurysm, without rupture, unspecified: Secondary | ICD-10-CM | POA: Diagnosis not present

## 2022-09-15 DIAGNOSIS — I7121 Aneurysm of the ascending aorta, without rupture: Secondary | ICD-10-CM | POA: Insufficient documentation

## 2022-09-15 MED ORDER — IOHEXOL 350 MG/ML SOLN
80.0000 mL | Freq: Once | INTRAVENOUS | Status: AC | PRN
  Administered 2022-09-15: 80 mL via INTRAVENOUS

## 2022-10-04 ENCOUNTER — Ambulatory Visit: Payer: BC Managed Care – PPO | Admitting: Cardiology

## 2022-10-10 DIAGNOSIS — E7800 Pure hypercholesterolemia, unspecified: Secondary | ICD-10-CM | POA: Diagnosis not present

## 2022-10-10 DIAGNOSIS — Z79899 Other long term (current) drug therapy: Secondary | ICD-10-CM | POA: Diagnosis not present

## 2022-10-10 DIAGNOSIS — E559 Vitamin D deficiency, unspecified: Secondary | ICD-10-CM | POA: Diagnosis not present

## 2022-10-10 DIAGNOSIS — E039 Hypothyroidism, unspecified: Secondary | ICD-10-CM | POA: Diagnosis not present

## 2022-10-11 DIAGNOSIS — E785 Hyperlipidemia, unspecified: Secondary | ICD-10-CM

## 2022-10-11 HISTORY — DX: Hyperlipidemia, unspecified: E78.5

## 2022-10-11 NOTE — Progress Notes (Unsigned)
Cardiology Office Note   Date:  10/12/2022   ID:  Shanon, Seawright Jan 12, 1956, MRN [id number]  PCP:  Lawerance Cruel, MD  Cardiologist:   None   Chief Complaint  Patient presents with   Chest Pain       History of Present Illness: [name] is a 66 y.o. female who presents for follow up of chest pain.   She had a CT cardiac score in August 2015 that showed calcium score of 100 which placed her at 8 percentile for age and sex matched control, she also had mildly dilated aortic root measuring 42 mm.  She had a cardiac catheterization in 2015 that demonstrated no coronary artery disease but there was some calcification proximally in the LAD.  Echocardiogram obtained on 07/13/2014 showed EF 55 to 60%, the echocardiogram did not demonstrate any aortic root enlargement.  She also has a history of palpitation.  In 2019 she had chest pain and palpitations and she had a low risk stress test.  She had a mildly increased size of her aorta on CT last year.   She had rare atrial tach on Holter.     Earlier this month her aorta was the same size at 45 mm.  Of note after I saw her last year I checked a lipid level and her LDL was not at target. She was off of her previous statin.  She did not restart pravastatin as I had suggested but she did have a repeat lipid profile the other day and her total was 246 with an LDL of 151.  She wants to reconsider a statin.  She has been physically active.  She bikes.  She has been getting chest discomfort.  It was somewhat different that she was having in 2019 with a stress test.  This is a mid left chest discomfort.  It is dull.  Like a muscle ache.  Its not having with exercise but it is not very noticeable.  She is not describing associated nausea vomiting or diaphoresis.  She is not having associated shortness of breath, PND or orthopnea.  Has had no palpitations, presyncope or syncope.   Past Medical History:  Diagnosis Date   Thyroid disease      Past Surgical History:  Procedure Laterality Date   AIKEN OSTEOTOMY     APPENDECTOMY     CORONARY ANGIOGRAM  07/22/14   normal cors   HAMMER TOE SURGERY     LAPAROSCOPIC OOPHERECTOMY     LEFT HEART CATHETERIZATION WITH CORONARY ANGIOGRAM N/A 07/22/2014   Procedure: LEFT HEART CATHETERIZATION WITH CORONARY ANGIOGRAM;  Surgeon: Troy Sine, MD;  Location: [facility] CATH LAB;  Service: Cardiovascular;  Laterality: N/A;   METATARSAL OSTEOTOMY WITH BUNIONECTOMY     NASAL SINUS SURGERY     NEURECTOMY FOOT     TUBAL LIGATION     TUBAL LIGATION       Current Outpatient Medications  Medication Sig Dispense Refill   ALPRAZolam (XANAX) 0.25 MG tablet Take 0.25 mg by mouth 2 (two) times daily as needed.     Cholecalciferol (VITAMIN D3) 125 MCG (5000 UT) CAPS Take 1 capsule by mouth daily.     levothyroxine (SYNTHROID) 50 MCG tablet Take 50 mcg by mouth daily.     propranolol (INDERAL) 10 MG tablet TAKE 1 TABLET BY MOUTH EVERY 8 HOURS AS NEEDED 90 tablet 1   rosuvastatin (CRESTOR) 20 MG tablet Take 1 tablet (20 mg total) by mouth daily.  90 tablet 3   No current facility-administered medications for this visit.    Allergies:   Sulfa antibiotics and Tetanus toxoids    ROS:  Please see the history of present illness.   Otherwise, review of systems are positive for none.   All other systems are reviewed and negative.    PHYSICAL EXAM: VS:  BP 132/78   Pulse (!) 52   Ht 5\' 5"  (1.651 m)   Wt 125 lb 12.8 oz (57.1 kg)   SpO2 93%   BMI 20.93 kg/m  , BMI Body mass index is 20.93 kg/m. GENERAL:  Well appearing NECK:  No jugular venous distention, waveform within normal limits, carotid upstroke brisk and symmetric, no bruits, no thyromegaly LUNGS:  Clear to auscultation bilaterally CHEST:  Unremarkable HEART:  PMI not displaced or sustained,S1 and S2 within normal limits, no S3, no S4, no clicks, no rubs, no murmurs ABD:  Flat, positive bowel sounds normal in frequency in pitch, no bruits, no  rebound, no guarding, no midline pulsatile mass, no hepatomegaly, no splenomegaly EXT:  2 plus pulses throughout, no edema, no cyanosis no clubbing   EKG:  EKG is  ordered today. Sinus bradycardia, rate 52, axis within normal limits, intervals within normal limits, no acute ST-T wave changes.  Inferolateral T wave inversions are unchanged from previous   Recent Labs: No results found for requested labs within last 365 days.    Lipid Panel    Component Value Date/Time   CHOL 249 (H) 08/23/2021 0920   TRIG 59 08/23/2021 0920   HDL 88 08/23/2021 0920   CHOLHDL 2.8 08/23/2021 0920   CHOLHDL 3.1 07/21/2014 1006   VLDL 21 07/21/2014 1006   LDLCALC 151 (H) 08/23/2021 0920      Wt Readings from Last 3 Encounters:  10/12/22 125 lb 12.8 oz (57.1 kg)  08/23/21 122 lb 3.2 oz (55.4 kg)  08/18/20 128 lb (58.1 kg)      Other studies Reviewed: Additional studies/ records that were reviewed today include: Labs. Review of the above records demonstrates:  Please see elsewhere in the note.     ASSESSMENT AND PLAN:  Chest pain:      She does have coronary calcium.  With her chest discomfort I would like to screen her with a POET (Plain Old Exercise Treadmill).  I think the pretest probability of obstructive coronary disease is low.   Ascending aortic and aneurysm:     This was 45 mm on the CT earlier this on 09/15/22.   I will repeat this again in 1 year.  HTN:   Blood pressures are controlled.  No change in therapy.   Dyslipidemia:    Given the lipids above she consents to trying Crestor 20 mg daily again.  She thought it caused some muscle aches but in retrospect she thinks that was not the Crestor.  We will get a lipid profile again in 3 months and I think a goal LDL should be at least less than 70.   Current medicines are reviewed at length with the patient today.  The patient does not have concerns regarding medicines.  The following changes have been made: As above  Labs/ tests  ordered today include:      Orders Placed This Encounter  Procedures   Lipid panel   Exercise Tolerance Test   EKG 12-Lead      Disposition:   FU with me in 12 months   Signed, Minus Breeding, MD  10/12/2022 12:09  PM    [facility]

## 2022-10-12 ENCOUNTER — Encounter: Payer: Self-pay | Admitting: Cardiology

## 2022-10-12 ENCOUNTER — Ambulatory Visit: Payer: BC Managed Care – PPO | Attending: Cardiology | Admitting: Cardiology

## 2022-10-12 VITALS — BP 132/78 | HR 52 | Ht 65.0 in | Wt 125.8 lb

## 2022-10-12 DIAGNOSIS — R079 Chest pain, unspecified: Secondary | ICD-10-CM

## 2022-10-12 DIAGNOSIS — I10 Essential (primary) hypertension: Secondary | ICD-10-CM | POA: Diagnosis not present

## 2022-10-12 DIAGNOSIS — E785 Hyperlipidemia, unspecified: Secondary | ICD-10-CM | POA: Diagnosis not present

## 2022-10-12 DIAGNOSIS — I7121 Aneurysm of the ascending aorta, without rupture: Secondary | ICD-10-CM | POA: Diagnosis not present

## 2022-10-12 MED ORDER — ROSUVASTATIN CALCIUM 20 MG PO TABS: 20.0000 mg | ORAL_TABLET | Freq: Every day | ORAL | 3 refills | Status: DC

## 2022-10-12 NOTE — Patient Instructions (Addendum)
Medication Instructions:  START Rosuvastatin 20 mg once daily  *If you need a refill on your cardiac medications before your next appointment, please call your pharmacy*   Lab Work: Your provider would like for you to return in 3 months  to have the following labs drawn: Fasting Lipid. You do not need an appointment for the lab. Once in our office lobby there is a podium where you can sign in and ring the doorbell to alert [place] that you are here. The lab is open from 8:00 am to 4 pm; closed for lunch from 12:45pm-1:45pm.  You may also go to any of these [facility] locations:   [place] ([place]) - [address]    If you have labs (blood work) drawn today and your tests are completely normal, you will receive your results only by: [employer] (if you have MyChart) OR A paper copy in the mail If you have any lab test that is abnormal or we need to change your treatment, we will call you to review the results.   Testing/Procedures: Your physician has requested that you have an exercise tolerance test.  Please also follow instruction sheet, as given. This will take place at [place], [address]. Do not drink or eat foods with caffeine for 24 hours before the test. (Chocolate, coffee, tea, or energy drinks) If you use an inhaler, bring it with you to the test. Do not smoke for 4 hours before the test. Wear comfortable shoes and clothing.  Follow-Up: At [facility], you and your health needs are our priority.  As part of our continuing mission to provide you with exceptional heart care, we have created designated Provider Care Teams.  These Care Teams include your primary Cardiologist (physician) and Advanced Practice Providers (APPs -  Physician Assistants and Nurse Practitioners) who all work together to provide you with the care  you need, when you need it.  We recommend signing up for the patient portal called "MyChart".  Sign up information is provided on this After Visit Summary.  MyChart is used to connect with patients for Virtual Visits (Telemedicine).  Patients are able to view lab/test results, encounter notes, upcoming appointments, etc.  Non-urgent messages can be sent to your provider as well.   To learn more about what you can do with MyChart, go to [email or web address].    Your next appointment:   12 month(s)  The format for your next appointment:   In Person  Provider:   Dr. Percival Spanish  Important Information About Sugar

## 2022-10-17 DIAGNOSIS — E559 Vitamin D deficiency, unspecified: Secondary | ICD-10-CM | POA: Diagnosis not present

## 2022-10-17 DIAGNOSIS — Z0000 Encounter for general adult medical examination without abnormal findings: Secondary | ICD-10-CM | POA: Diagnosis not present

## 2022-10-17 DIAGNOSIS — F419 Anxiety disorder, unspecified: Secondary | ICD-10-CM | POA: Diagnosis not present

## 2022-10-17 DIAGNOSIS — E039 Hypothyroidism, unspecified: Secondary | ICD-10-CM | POA: Diagnosis not present

## 2022-10-17 DIAGNOSIS — Q309 Congenital malformation of nose, unspecified: Secondary | ICD-10-CM | POA: Diagnosis not present

## 2022-10-24 ENCOUNTER — Encounter: Payer: Self-pay | Admitting: Cardiology

## 2022-10-25 ENCOUNTER — Telehealth (HOSPITAL_COMMUNITY): Payer: Self-pay | Admitting: *Deleted

## 2022-10-25 NOTE — Telephone Encounter (Signed)
Per DPR left detailed instructions for ETT on 10/27/22.

## 2022-10-26 ENCOUNTER — Encounter (HOSPITAL_COMMUNITY): Payer: Self-pay

## 2022-10-27 ENCOUNTER — Encounter (HOSPITAL_COMMUNITY): Payer: BC Managed Care – PPO

## 2022-11-18 ENCOUNTER — Telehealth (HOSPITAL_COMMUNITY): Payer: Self-pay | Admitting: *Deleted

## 2022-11-18 NOTE — Telephone Encounter (Signed)
Patient given instructions for upcoming ETT appointment on 11/21/22 at 3:30

## 2022-11-21 ENCOUNTER — Ambulatory Visit (HOSPITAL_COMMUNITY): Payer: BC Managed Care – PPO | Attending: Cardiology

## 2022-11-21 DIAGNOSIS — R079 Chest pain, unspecified: Secondary | ICD-10-CM

## 2022-11-22 LAB — EXERCISE TOLERANCE TEST
Base ST Depression (mm): 0 mm
Estimated workload: 14.3
Exercise duration (min): 12 min
Exercise duration (sec): 30 s
MPHR: 155 {beats}/min
Peak HR: 162 {beats}/min
Percent HR: 104 %
RPE: 18
Rest HR: 44 {beats}/min
ST Depression (mm): 0 mm

## 2022-12-23 DIAGNOSIS — R631 Polydipsia: Secondary | ICD-10-CM | POA: Diagnosis not present

## 2022-12-23 DIAGNOSIS — E039 Hypothyroidism, unspecified: Secondary | ICD-10-CM | POA: Diagnosis not present

## 2022-12-23 DIAGNOSIS — R638 Other symptoms and signs concerning food and fluid intake: Secondary | ICD-10-CM | POA: Diagnosis not present

## 2023-01-18 ENCOUNTER — Ambulatory Visit (INDEPENDENT_AMBULATORY_CARE_PROVIDER_SITE_OTHER): Payer: HMO

## 2023-01-18 ENCOUNTER — Ambulatory Visit (INDEPENDENT_AMBULATORY_CARE_PROVIDER_SITE_OTHER): Payer: HMO | Admitting: Podiatry

## 2023-01-18 DIAGNOSIS — M79671 Pain in right foot: Secondary | ICD-10-CM

## 2023-01-18 DIAGNOSIS — M7751 Other enthesopathy of right foot: Secondary | ICD-10-CM | POA: Diagnosis not present

## 2023-01-18 DIAGNOSIS — M21611 Bunion of right foot: Secondary | ICD-10-CM

## 2023-01-18 MED ORDER — TRIAMCINOLONE ACETONIDE 10 MG/ML IJ SUSP
10.0000 mg | Freq: Once | INTRAMUSCULAR | Status: AC
  Administered 2023-01-18: 10 mg

## 2023-01-20 NOTE — Progress Notes (Signed)
Subjective:   Patient ID: [name], female   DOB: 67 y.o.   MRN: [id number]   HPI Patient presents stating that she has a lot of pain in her fourth toe she has a lot of pain in the joints of her right foot and other joints of her body.  States that she has not been checked for arthritis or other pathology in a long time but feels like something is wrong   ROS      Objective:  Physical Exam  Neurovascular status intact with swelling of the inner phalangeal joint distal digit 4 right with proximal inflammation around the MPJs right foot with history of surgery which did help the condition she had but having problems with this currently     Assessment:  Possibility for systemic inflammation or some other kind of systemic inflammatory condition with inflammatory condition acute of the interphalangeal joint digit 4 right     Plan:  H&P x-ray reviewed condition discussed careful injection of the inner phalangeal joint distal digit 4 right and advised on wider shoe gear.  Patient will be seen back to recheck and I am ordering blood work to understand if there may be a systemic arthritic condition  X-rays do indicate some lysis around the metatarsal heads nothing of significance with what appears to be possible arthritis of the inner phalangeal joint digit 4 right

## 2023-01-23 DIAGNOSIS — M21611 Bunion of right foot: Secondary | ICD-10-CM | POA: Diagnosis not present

## 2023-01-24 LAB — C-REACTIVE PROTEIN: CRP: 0.3 mg/L (ref <8.0)

## 2023-01-24 LAB — COMPREHENSIVE METABOLIC PANEL
AG Ratio: 2 (calc) (ref 1.0–2.5)
ALT: 24 U/L (ref 6–29)
AST: 27 U/L (ref 10–35)
Albumin: 4.5 g/dL (ref 3.6–5.1)
Alkaline phosphatase (APISO): 54 U/L (ref 37–153)
BUN: 14 mg/dL (ref 7–25)
CO2: 27 mmol/L (ref 20–32)
Calcium: 9.4 mg/dL (ref 8.6–10.4)
Chloride: 98 mmol/L (ref 98–110)
Creat: 0.77 mg/dL (ref 0.50–1.05)
Globulin: 2.3 g/dL (calc) (ref 1.9–3.7)
Glucose, Bld: 89 mg/dL (ref 65–99)
Potassium: 4.5 mmol/L (ref 3.5–5.3)
Sodium: 134 mmol/L — ABNORMAL LOW (ref 135–146)
Total Bilirubin: 0.7 mg/dL (ref 0.2–1.2)
Total Protein: 6.8 g/dL (ref 6.1–8.1)

## 2023-01-24 LAB — CBC WITH DIFFERENTIAL/PLATELET
Absolute Monocytes: 476 cells/uL (ref 200–950)
Basophils Absolute: 29 cells/uL (ref 0–200)
Basophils Relative: 0.5 %
Eosinophils Absolute: 58 cells/uL (ref 15–500)
Eosinophils Relative: 1 %
HCT: 39.6 % (ref 35.0–45.0)
Hemoglobin: 13.5 g/dL (ref 11.7–15.5)
Lymphs Abs: 2581 cells/uL (ref 850–3900)
MCH: 32.5 pg (ref 27.0–33.0)
MCHC: 34.1 g/dL (ref 32.0–36.0)
MCV: 95.4 fL (ref 80.0–100.0)
MPV: 9.7 fL (ref 7.5–12.5)
Monocytes Relative: 8.2 %
Neutro Abs: 2656 cells/uL (ref 1500–7800)
Neutrophils Relative %: 45.8 %
Platelets: 288 10*3/uL (ref 140–400)
RBC: 4.15 10*6/uL (ref 3.80–5.10)
RDW: 12.1 % (ref 11.0–15.0)
Total Lymphocyte: 44.5 %
WBC: 5.8 10*3/uL (ref 3.8–10.8)

## 2023-01-24 LAB — RHEUMATOID FACTOR: Rheumatoid fact SerPl-aCnc: <14 IU/mL (ref <14)

## 2023-01-24 LAB — SEDIMENTATION RATE: Sed Rate: 9 mm/h (ref 0–30)

## 2023-01-24 LAB — URIC ACID: Uric Acid, Serum: 4.3 mg/dL (ref 2.5–7.0)

## 2023-01-25 ENCOUNTER — Encounter: Payer: Self-pay | Admitting: Podiatry

## 2023-01-25 ENCOUNTER — Ambulatory Visit (INDEPENDENT_AMBULATORY_CARE_PROVIDER_SITE_OTHER): Payer: HMO | Admitting: Podiatry

## 2023-01-25 DIAGNOSIS — M7751 Other enthesopathy of right foot: Secondary | ICD-10-CM | POA: Diagnosis not present

## 2023-01-25 DIAGNOSIS — M2041 Other hammer toe(s) (acquired), right foot: Secondary | ICD-10-CM | POA: Diagnosis not present

## 2023-01-25 MED ORDER — PREDNISONE 10 MG PO TABS: ORAL_TABLET | ORAL | 0 refills | Status: DC

## 2023-01-25 NOTE — Progress Notes (Signed)
Subjective:   Patient ID: [name], female   DOB: 67 y.o.   MRN: [id number]   HPI Patient presents stating a lot of pain in the bottom of her right foot and states that the fourth toe while it is not red and swollen like it was before is still sore   ROS      Objective:  Physical Exam  Neurovascular status found to be intact with significant inflammation pain in the lesser MPJs right and pain with swelling of the fourth digit right with blood work which did come back relatively normal with no indications currently of a nerve arthritic inflammatory or gout condition     Assessment:  Very difficult to make complete auscultation as to why she is having so much pain for 3 weeks duration but she is not able to bear any plantar weight on her foot currently     Plan:  Reviewed condition at great length do not recommend further injection therapy as it was already mildly helpful and at this point applied air fracture walker to completely immobilize the foot take all pressure off the plantar surface did place on Sterapred 12-day Dosepak.  Reappoint to recheck 3 weeks or earlier if needed

## 2023-02-08 ENCOUNTER — Ambulatory Visit (INDEPENDENT_AMBULATORY_CARE_PROVIDER_SITE_OTHER): Payer: HMO | Admitting: Podiatry

## 2023-02-08 DIAGNOSIS — G90521 Complex regional pain syndrome I of right lower limb: Secondary | ICD-10-CM | POA: Diagnosis not present

## 2023-02-08 DIAGNOSIS — M2041 Other hammer toe(s) (acquired), right foot: Secondary | ICD-10-CM

## 2023-02-08 DIAGNOSIS — M792 Neuralgia and neuritis, unspecified: Secondary | ICD-10-CM

## 2023-02-08 MED ORDER — GABAPENTIN 300 MG PO CAPS: 300.0000 mg | ORAL_CAPSULE | Freq: Three times a day (TID) | ORAL | 3 refills | Status: DC

## 2023-02-08 NOTE — Progress Notes (Signed)
Subjective:  Patient ID: [name], female    DOB: October 19, 1956,  MRN: [id number]  Chief Complaint  Patient presents with   Toe Pain    Pt stated that she normally sees dr Paulla Dolly but she is still having a lot of pain in her toe     67 y.o. female presents with the above complaint.  Patient presents with complaint of right fourth toe pain.  Patient states that she is still having a lot of pain in her toe.  She had received injections in the and other conservative care by Dr. Paulla Dolly in the past but this fourth toe still causing a lot of pain and appears to be very nervelike in nature.  She wanted to get it evaluated she has not seen anyone else prior to seeing me.   Review of Systems: Negative except as noted in the HPI. Denies N/V/F/Ch.  Past Medical History:  Diagnosis Date   Thyroid disease     Current Outpatient Medications:    gabapentin (NEURONTIN) 300 MG capsule, Take 1 capsule (300 mg total) by mouth 3 (three) times daily., Disp: 90 capsule, Rfl: 3   ALPRAZolam (XANAX) 0.25 MG tablet, Take 0.25 mg by mouth 2 (two) times daily as needed., Disp: , Rfl:    Cholecalciferol (VITAMIN D3) 125 MCG (5000 UT) CAPS, Take 1 capsule by mouth daily., Disp: , Rfl:    levothyroxine (SYNTHROID) 50 MCG tablet, Take 50 mcg by mouth daily., Disp: , Rfl:    predniSONE (DELTASONE) 10 MG tablet, 12 day tapering dose, Disp: 48 tablet, Rfl: 0   propranolol (INDERAL) 10 MG tablet, TAKE 1 TABLET BY MOUTH EVERY 8 HOURS AS NEEDED, Disp: 90 tablet, Rfl: 1   rosuvastatin (CRESTOR) 20 MG tablet, Take 1 tablet (20 mg total) by mouth daily., Disp: 90 tablet, Rfl: 3  Social History   Tobacco Use  Smoking Status Never  Smokeless Tobacco Never    Allergies  Allergen Reactions   Sulfa Antibiotics     Childhood allergy   Tetanus Toxoids Rash    Pain and swelling at injection site   Objective:  There were no vitals filed for this visit. There is no height or weight on file to calculate  BMI. Constitutional Well developed. Well nourished.  Vascular Dorsalis pedis pulses palpable bilaterally. Posterior tibial pulses palpable bilaterally. Capillary refill normal to all digits.  No cyanosis or clubbing noted. Pedal hair growth normal.  Neurologic Normal speech. Oriented to person, place, and time. Epicritic sensation to light touch grossly present bilaterally.  Dermatologic Pain on palpation to the right fourth toe.  No erythema noted no clinical signs of infection noted.  Patient pain primarily nervelike in nature.  Orthopedic: Normal joint ROM without pain or crepitus bilaterally. No visible deformities. No bony tenderness.   Radiographs: None Assessment:   1. Complex regional pain syndrome type 1 of right lower extremity   2. Hammer toe of right foot   3. Neuritis    Plan:  Patient was evaluated and treated and all questions answered.  Right fourth toe with generalized pain with history of surgery many years ago -All questions and concerns were discussed with the patient in extensive detail -Given the amount of pain that she is experiencing she will benefit from gabapentin as this appears to be more nervelike in nature. -She may also benefit from pain management as well given that she may be experiencing some CRPS type symptoms.  I discussed this with her she states understand would  like to think about pain management -Gabapentin was sent to the pharmacy  No follow-ups on file.

## 2023-02-13 ENCOUNTER — Ambulatory Visit: Payer: HMO | Admitting: Podiatry

## 2023-02-14 DIAGNOSIS — J302 Other seasonal allergic rhinitis: Secondary | ICD-10-CM | POA: Diagnosis not present

## 2023-02-14 DIAGNOSIS — D140 Benign neoplasm of middle ear, nasal cavity and accessory sinuses: Secondary | ICD-10-CM | POA: Diagnosis not present

## 2023-02-14 DIAGNOSIS — Z9889 Other specified postprocedural states: Secondary | ICD-10-CM | POA: Diagnosis not present

## 2023-02-14 DIAGNOSIS — J0101 Acute recurrent maxillary sinusitis: Secondary | ICD-10-CM | POA: Diagnosis not present

## 2023-02-27 ENCOUNTER — Ambulatory Visit: Payer: HMO | Admitting: Podiatry

## 2023-03-01 ENCOUNTER — Ambulatory Visit: Payer: HMO | Admitting: Podiatry

## 2023-03-13 DIAGNOSIS — Z85828 Personal history of other malignant neoplasm of skin: Secondary | ICD-10-CM | POA: Diagnosis not present

## 2023-03-13 DIAGNOSIS — D0461 Carcinoma in situ of skin of right upper limb, including shoulder: Secondary | ICD-10-CM | POA: Diagnosis not present

## 2023-03-13 DIAGNOSIS — L821 Other seborrheic keratosis: Secondary | ICD-10-CM | POA: Diagnosis not present

## 2023-03-13 DIAGNOSIS — D0462 Carcinoma in situ of skin of left upper limb, including shoulder: Secondary | ICD-10-CM | POA: Diagnosis not present

## 2023-03-13 DIAGNOSIS — D485 Neoplasm of uncertain behavior of skin: Secondary | ICD-10-CM | POA: Diagnosis not present

## 2023-03-13 DIAGNOSIS — D225 Melanocytic nevi of trunk: Secondary | ICD-10-CM | POA: Diagnosis not present

## 2023-03-13 DIAGNOSIS — L819 Disorder of pigmentation, unspecified: Secondary | ICD-10-CM | POA: Diagnosis not present

## 2023-03-31 DIAGNOSIS — D140 Benign neoplasm of middle ear, nasal cavity and accessory sinuses: Secondary | ICD-10-CM | POA: Diagnosis not present

## 2023-04-28 DIAGNOSIS — I7121 Aneurysm of the ascending aorta, without rupture: Secondary | ICD-10-CM | POA: Diagnosis not present

## 2023-04-28 DIAGNOSIS — M792 Neuralgia and neuritis, unspecified: Secondary | ICD-10-CM | POA: Diagnosis not present

## 2023-04-28 DIAGNOSIS — E039 Hypothyroidism, unspecified: Secondary | ICD-10-CM | POA: Diagnosis not present

## 2023-04-28 DIAGNOSIS — I700 Atherosclerosis of aorta: Secondary | ICD-10-CM | POA: Diagnosis not present

## 2023-04-28 DIAGNOSIS — Z6821 Body mass index (BMI) 21.0-21.9, adult: Secondary | ICD-10-CM | POA: Diagnosis not present

## 2023-04-28 DIAGNOSIS — F430 Acute stress reaction: Secondary | ICD-10-CM | POA: Diagnosis not present

## 2023-05-15 ENCOUNTER — Telehealth: Payer: Self-pay | Admitting: Cardiology

## 2023-05-15 ENCOUNTER — Emergency Department (HOSPITAL_BASED_OUTPATIENT_CLINIC_OR_DEPARTMENT_OTHER)
Admission: EM | Admit: 2023-05-15 | Discharge: 2023-05-16 | Disposition: A | Discharge status: Home / Self Care | Payer: HMO | Attending: Emergency Medicine | Admitting: Emergency Medicine

## 2023-05-15 ENCOUNTER — Encounter (HOSPITAL_BASED_OUTPATIENT_CLINIC_OR_DEPARTMENT_OTHER): Payer: Self-pay

## 2023-05-15 ENCOUNTER — Other Ambulatory Visit: Payer: Self-pay

## 2023-05-15 ENCOUNTER — Emergency Department (HOSPITAL_BASED_OUTPATIENT_CLINIC_OR_DEPARTMENT_OTHER): Payer: HMO | Admitting: Radiology

## 2023-05-15 DIAGNOSIS — I10 Essential (primary) hypertension: Secondary | ICD-10-CM | POA: Insufficient documentation

## 2023-05-15 DIAGNOSIS — M25512 Pain in left shoulder: Secondary | ICD-10-CM | POA: Diagnosis not present

## 2023-05-15 DIAGNOSIS — Z79899 Other long term (current) drug therapy: Secondary | ICD-10-CM | POA: Insufficient documentation

## 2023-05-15 DIAGNOSIS — R079 Chest pain, unspecified: Secondary | ICD-10-CM | POA: Diagnosis not present

## 2023-05-15 DIAGNOSIS — R0789 Other chest pain: Secondary | ICD-10-CM | POA: Diagnosis not present

## 2023-05-15 DIAGNOSIS — J439 Emphysema, unspecified: Secondary | ICD-10-CM | POA: Diagnosis not present

## 2023-05-15 LAB — CBC
HCT: 37.2 % (ref 36.0–46.0)
Hemoglobin: 13 g/dL (ref 12.0–15.0)
MCH: 32.3 pg (ref 26.0–34.0)
MCHC: 34.9 g/dL (ref 30.0–36.0)
MCV: 92.5 fL (ref 80.0–100.0)
Platelets: 280 10*3/uL (ref 150–400)
RBC: 4.02 MIL/uL (ref 3.87–5.11)
RDW: 12.4 % (ref 11.5–15.5)
WBC: 7.3 10*3/uL (ref 4.0–10.5)
nRBC: 0 % (ref 0.0–0.2)

## 2023-05-15 LAB — BASIC METABOLIC PANEL
Anion gap: 13 (ref 5–15)
BUN: 12 mg/dL (ref 8–23)
CO2: 23 mmol/L (ref 22–32)
Calcium: 9.5 mg/dL (ref 8.9–10.3)
Chloride: 104 mmol/L (ref 98–111)
Creatinine, Ser: 0.67 mg/dL (ref 0.44–1.00)
GFR, Estimated: >60 mL/min (ref >60)
Glucose, Bld: 93 mg/dL (ref 70–99)
Potassium: 3.7 mmol/L (ref 3.5–5.1)
Sodium: 140 mmol/L (ref 135–145)

## 2023-05-15 LAB — TROPONIN I (HIGH SENSITIVITY): Troponin I (High Sensitivity): 16 ng/L (ref <18)

## 2023-05-15 NOTE — Telephone Encounter (Signed)
Patient c/o Palpitations:  High priority if patient c/o lightheadedness, shortness of breath, or chest pain  How long have you had palpitations/irregular HR/ Afib? Are you having the symptoms now?   Yes  Are you currently experiencing lightheadedness, SOB or CP?   Yes  Do you have a history of afib (atrial fibrillation) or irregular heart rhythm?   Not really  Have you checked your BP or HR? (document readings if available):   BP 152/95  HR 62  Are you experiencing any other symptoms?   Headache, nausea on and off

## 2023-05-15 NOTE — Telephone Encounter (Signed)
Spoke with pt, offered appointment Thursday this week and the patient is not available. First available with dr hochrein scheduled for 06/01/23. Patient voiced understanding to go to the ER or call prior to that appointment with concerns.

## 2023-05-15 NOTE — Telephone Encounter (Signed)
Spoke to the patient, she is experiencing chest discomfort for years however, it has worsen the past couple of weeks. She is also experiencing lightheadedness, left arm/shoulder pain and mild shortness of breath for the past couple of week. Pt stated she is currently under a lot of stress due to upcoming surgery's. Pt does monitor bp at home, yesterday 152/95 and  this morning 152/95. She is  prescribed propranolol 10 mg, pt stated she did take two and this did not relieve her symptoms. Explained ED precautions, patient voiced understanding, and stated she will go to the ED at [facility]. Will forward to MD and nurse.

## 2023-05-15 NOTE — ED Triage Notes (Signed)
Patient here POV from Home.  Endorses Left Sided CP, left Upper Back Pain and left Arm Pain that began a few days ago. Mostly consistent and worsened today. Propanolol and Xanax without relief.  Some SOB and nausea. No Emesis.   NAD Noted during triage. A&Ox4. Gcs 15. Ambulatory.

## 2023-05-16 ENCOUNTER — Encounter (HOSPITAL_BASED_OUTPATIENT_CLINIC_OR_DEPARTMENT_OTHER): Payer: Self-pay

## 2023-05-16 ENCOUNTER — Emergency Department (HOSPITAL_BASED_OUTPATIENT_CLINIC_OR_DEPARTMENT_OTHER): Payer: HMO

## 2023-05-16 DIAGNOSIS — I719 Aortic aneurysm of unspecified site, without rupture: Secondary | ICD-10-CM | POA: Diagnosis not present

## 2023-05-16 DIAGNOSIS — R0789 Other chest pain: Secondary | ICD-10-CM | POA: Diagnosis not present

## 2023-05-16 LAB — TROPONIN I (HIGH SENSITIVITY): Troponin I (High Sensitivity): 17 ng/L (ref <18)

## 2023-05-16 MED ORDER — IOHEXOL 350 MG/ML SOLN
100.0000 mL | Freq: Once | INTRAVENOUS | Status: AC | PRN
  Administered 2023-05-16: 85 mL via INTRAVENOUS

## 2023-05-16 NOTE — ED Provider Notes (Addendum)
[place] EMERGENCY DEPARTMENT AT [facility]  Provider Note  CSN: [id number] Arrival date & time: 05/15/23 2009  History Chief Complaint  Patient presents with   Chest Pain    [name] is a 67 y.o. female with known history of thoracic aortic aneurysm, followed by cardiology, last imaged Oct 2023 with no interval change. She reports about 2 weeks of vague chest discomfort but became more consistent today and associated with aching pain in L shoulder/scapula. She has not had any cough, fever or SOB. No recent travel. She has HTN and anxiety, no known CAD. She had a normal stress test in Dec 2023.    Home Medications Prior to Admission medications   Medication Sig Start Date End Date Taking? Authorizing Provider  ALPRAZolam (XANAX) 0.25 MG tablet Take 0.25 mg by mouth 2 (two) times daily as needed. 06/23/20   [provider]  Cholecalciferol (VITAMIN D3) 125 MCG (5000 UT) CAPS Take 1 capsule by mouth daily.    [provider]  gabapentin (NEURONTIN) 300 MG capsule Take 1 capsule (300 mg total) by mouth 3 (three) times daily. 02/08/23   Candelaria Stagers, DPM  levothyroxine (SYNTHROID) 50 MCG tablet Take 50 mcg by mouth daily. 11/22/20   [provider]  predniSONE (DELTASONE) 10 MG tablet 12 day tapering dose 01/25/23   Regal, Kirstie Peri, DPM  propranolol (INDERAL) 10 MG tablet TAKE 1 TABLET BY MOUTH EVERY 8 HOURS AS NEEDED 01/12/22   Rollene Rotunda, MD  rosuvastatin (CRESTOR) 20 MG tablet Take 1 tablet (20 mg total) by mouth daily. 10/12/22 10/07/23  Rollene Rotunda, MD  omeprazole (PRILOSEC) 20 MG capsule Take 1 capsule (20 mg total) by mouth daily. Patient not taking: Reported on 06/14/2019 10/31/18 06/15/19  Arby Barrette, MD     Allergies    Sulfa antibiotics and Tetanus toxoids   Review of Systems   Review of Systems Please see HPI for pertinent positives and negatives  Physical Exam BP (!) 149/81   Pulse (!) 58   Temp 98 F (36.7 C)    Resp 20   Ht 5\' 5"  (1.651 m)   Wt 56.7 kg   SpO2 98%   BMI 20.80 kg/m   Physical Exam Vitals and nursing note reviewed.  Constitutional:      Appearance: Normal appearance.  HENT:     Head: Normocephalic and atraumatic.     Nose: Nose normal.     Mouth/Throat:     Mouth: Mucous membranes are moist.  Eyes:     Extraocular Movements: Extraocular movements intact.     Conjunctiva/sclera: Conjunctivae normal.  Cardiovascular:     Rate and Rhythm: Normal rate.  Pulmonary:     Effort: Pulmonary effort is normal.     Breath sounds: Normal breath sounds.  Chest:     Chest wall: No tenderness.  Abdominal:     General: Abdomen is flat.     Palpations: Abdomen is soft.     Tenderness: There is no abdominal tenderness.  Musculoskeletal:        General: No swelling. Normal range of motion.     Cervical back: Neck supple.     Comments: Tenderness in L trapezius muscle  Skin:    General: Skin is warm and dry.  Neurological:     General: No focal deficit present.     Mental Status: She is alert.  Psychiatric:        Mood and Affect: Mood normal.  ED Results / Procedures / Treatments   EKG EKG Interpretation  Date/Time:  Monday May 15 2023 20:15:22 EDT Ventricular Rate:  67 PR Interval:  172 QRS Duration: 86 QT Interval:  378 QTC Calculation: 399 R Axis:   1 Text Interpretation: Normal sinus rhythm Possible Left atrial enlargement Borderline ECG When compared with ECG of 31-Oct-2018 08:28, PREVIOUS ECG IS PRESENT Confirmed by Ernie Avena ([id number]) on 05/15/2023 10:32:51 PM  Procedures Procedures  Medications Ordered in the ED Medications  iohexol (OMNIPAQUE) 350 MG/ML injection 100 mL (85 mLs Intravenous Contrast Given 05/16/23 0032)    Initial Impression and Plan  Patient here with atypical chest pain, could be MSK, vs anxiety but more concerning is her history of thoracic aortic aneurysm. Labs done in triage show normal CBC, BMP, and trop x 1. Will send for CTA to  eval aorta. I personally viewed the images from radiology studies and agree with radiologist interpretation: CXR without acute findings.   ED Course   Clinical Course as of 05/16/23 0243  Tue May 16, 2023  1610 Repeat Trop is unchanged.  [CS]  [id number] I personally viewed the images from radiology studies and agree with radiologist interpretation:  CT is negative for acute dissection. No significant change in aorta size. Patient reassured no acute life threatening cause found tonight. She is comfortable going home. She has follow up already scheduled. RTED for any other concerns.  [CS]    Clinical Course User Index [CS] Pollyann Savoy, MD     MDM Rules/Calculators/A&P Medical Decision Making Given presenting complaint, I considered that admission might be necessary. After review of results from ED lab and/or imaging studies, admission to the hospital is not indicated at this time.    Problems Addressed: Nonspecific chest pain: acute illness or injury  Amount and/or Complexity of Data Reviewed Labs: ordered. Decision-making details documented in ED Course. Radiology: ordered and independent interpretation performed. Decision-making details documented in ED Course. ECG/medicine tests: ordered and independent interpretation performed. Decision-making details documented in ED Course.  Risk Prescription drug management. Decision regarding hospitalization.     Final Clinical Impression(s) / ED Diagnoses Final diagnoses:  Nonspecific chest pain    Rx / DC Orders ED Discharge Orders     None           Pollyann Savoy, MD 05/16/23 [phone]

## 2023-05-30 NOTE — Progress Notes (Unsigned)
Cardiology Office Note:   Date:  06/01/2023  ID:  [name], DOB 1956-12-10, MRN [id number] PCP: Daisy Floro, MD  [facility] Providers Cardiologist:  Rollene Rotunda, MD {  History of Present Illness:   [name] is a 67 y.o. female who presents for evaluation of chest pain.   She had a CT cardiac score in August 2015 that showed calcium score of 100 which placed her at 40 percentile for age and sex matched control, she also had mildly dilated aortic root measuring 42 mm.  She had a cardiac catheterization in 2015 that demonstrated no coronary artery disease but there was some calcification proximally in the LAD.  Echocardiogram obtained on 07/13/2014 showed EF 55 to 60%, the echocardiogram did not demonstrate any aortic root enlargement.  She also has a history of palpitation.  In 2019 she had chest pain and palpitations and she had a low risk stress test.  She had a increased size of her aorta on CT of 45 mm.   She had rare atrial tach on Holter.     She was in the emergency room earlier this month with chest discomfort.  I did review these records for this visit.  She had chest discomfort.  This has been having emotional stress.  She feels this is a left shoulder discomfort.  It is also left upper chest.  It is 7 out of 10 in intensity.  She noted that day with her blood pressure is also 200/107.  When she gets this kind of discomfort she might take propranolol or Xanax and might last but can be over an hour.  She is not describing associated nausea vomiting or diaphoresis.  She does not bring this on the exertion but she has not been as active.  He is not having any new shortness of breath, PND or orthopnea.   He did have a CT of the ER which demonstrated no aortic abnormalities over the results.  It was 45 mm previously having been reported at 43.    ROS: As stated in the HPI and negative for all other systems.  Studies Reviewed:    EKG:   EKG  Interpretation  Date/Time:  Thursday June 01 2023 08:24:08 EDT Ventricular Rate:  55 PR Interval:  152 QRS Duration: 92 QT Interval:  408 QTC Calculation: 390 R Axis:   57 Text Interpretation: Sinus bradycardia T wave abnormalitiy in lateral lead When compared with ECG of 15-May-2023 20:15, T wave inversion now evident in Lateral leads Confirmed by Rollene Rotunda ([id number]) on 06/01/2023 8:56:57 AM     Risk Assessment/Calculations:              Physical Exam:   VS:  BP 118/78 (BP Location: Left Arm, Patient Position: Sitting, Cuff Size: Normal)   Pulse (!) 56   Ht 5\' 5"  (1.651 m)   Wt 120 lb 9.6 oz (54.7 kg)   SpO2 97%   BMI 20.07 kg/m    Wt Readings from Last 3 Encounters:  06/01/23 120 lb 9.6 oz (54.7 kg)  05/15/23 125 lb (56.7 kg)  11/21/22 125 lb (56.7 kg)     GEN: Well nourished, well developed in no acute distress NECK: No JVD; No carotid bruits CARDIAC: RRR, no murmurs, rubs, gallops RESPIRATORY:  Clear to auscultation without rales, wheezing or rhonchi  ABDOMEN: Soft, non-tender, non-distended EXTREMITIES:  No edema; No deformity   ASSESSMENT AND PLAN:   Chest pain:      She  does have coronary calcium.  She has some EKG changes as above.  She presents with the pain described above.  Given this the pretest probability of obstructive coronary disease is moderate and she will have a coronary CTA.   Ascending aortic and aneurysm:     This was 45 mm on the CT 09/15/22.  I will decide on the timing of follow-up after the CT above.  HTN:   The blood pressure is mildly elevated and I want it to be  in the 120s.  I will plan amlodipine 2.5 mg daily.   Dyslipidemia:    LDL not been checked in a while check an echo.  The goal should be      Follow up me in six months.   Signed, Rollene Rotunda, MD

## 2023-06-01 ENCOUNTER — Encounter: Payer: Self-pay | Admitting: Cardiology

## 2023-06-01 ENCOUNTER — Ambulatory Visit: Payer: HMO | Attending: Cardiology | Admitting: Cardiology

## 2023-06-01 ENCOUNTER — Other Ambulatory Visit: Payer: Self-pay

## 2023-06-01 VITALS — BP 118/78 | HR 56 | Ht 65.0 in | Wt 120.6 lb

## 2023-06-01 DIAGNOSIS — R072 Precordial pain: Secondary | ICD-10-CM | POA: Diagnosis not present

## 2023-06-01 DIAGNOSIS — R0789 Other chest pain: Secondary | ICD-10-CM | POA: Diagnosis not present

## 2023-06-01 DIAGNOSIS — I7121 Aneurysm of the ascending aorta, without rupture: Secondary | ICD-10-CM

## 2023-06-01 DIAGNOSIS — E785 Hyperlipidemia, unspecified: Secondary | ICD-10-CM

## 2023-06-01 DIAGNOSIS — R079 Chest pain, unspecified: Secondary | ICD-10-CM

## 2023-06-01 LAB — BASIC METABOLIC PANEL
BUN/Creatinine Ratio: 22 (ref 12–28)
BUN: 16 mg/dL (ref 8–27)
CO2: 26 mmol/L (ref 20–29)
Calcium: 9.7 mg/dL (ref 8.7–10.3)
Chloride: 102 mmol/L (ref 96–106)
Creatinine, Ser: 0.73 mg/dL (ref 0.57–1.00)
Glucose: 89 mg/dL (ref 70–99)
Potassium: 4.3 mmol/L (ref 3.5–5.2)
Sodium: 140 mmol/L (ref 134–144)
eGFR: 91 mL/min/{1.73_m2} (ref >59)

## 2023-06-01 MED ORDER — AMLODIPINE BESYLATE 2.5 MG PO TABS: 2.5000 mg | ORAL_TABLET | Freq: Every day | ORAL | 3 refills | Status: DC

## 2023-06-01 NOTE — Patient Instructions (Signed)
Medication Instructions:  Your physician has recommended you make the following change in your medication:  1) START taking amlodipine 2.5 mg daily  *If you need a refill on your cardiac medications before your next appointment, please call your pharmacy*  Lab Work: TODAY: Lipids, LpA, BMET   Testing/Procedures: Your physician has requested that you have cardiac CT. Cardiac computed tomography (CT) is a painless test that uses an x-ray machine to take clear, detailed pictures of your heart. For further information please visit [email or web address] Please follow instruction sheet as given.  Follow-Up: At [facility] you and your health needs are our priority.  As part of our continuing mission to provide you with exceptional heart care, we have created designated Provider Care Teams.  These Care Teams include your primary Cardiologist (physician) and Advanced Practice Providers (APPs -  Physician Assistants and Nurse Practitioners) who all work together to provide you with the care you need, when you need it.  Your next appointment:   1 year(s)  Provider:   Rollene Rotunda, MD

## 2023-06-02 ENCOUNTER — Encounter: Payer: Self-pay | Admitting: *Deleted

## 2023-06-06 ENCOUNTER — Ambulatory Visit (HOSPITAL_COMMUNITY)
Admission: RE | Admit: 2023-06-06 | Discharge: 2023-06-06 | Disposition: A | Discharge status: Home / Self Care | Payer: HMO | Source: Ambulatory Visit | Attending: Cardiology | Admitting: Cardiology

## 2023-06-06 DIAGNOSIS — R072 Precordial pain: Secondary | ICD-10-CM | POA: Insufficient documentation

## 2023-06-06 MED ORDER — NITROGLYCERIN 0.4 MG SL SUBL
0.8000 mg | SUBLINGUAL_TABLET | Freq: Once | SUBLINGUAL | Status: AC
  Administered 2023-06-06: 0.8 mg via SUBLINGUAL

## 2023-06-06 MED ORDER — IOHEXOL 350 MG/ML SOLN
95.0000 mL | Freq: Once | INTRAVENOUS | Status: AC | PRN
  Administered 2023-06-06: 95 mL via INTRAVENOUS

## 2023-06-14 ENCOUNTER — Encounter: Payer: Self-pay | Admitting: Cardiology

## 2023-06-14 DIAGNOSIS — E785 Hyperlipidemia, unspecified: Secondary | ICD-10-CM

## 2023-06-19 ENCOUNTER — Other Ambulatory Visit: Payer: Self-pay

## 2023-06-19 DIAGNOSIS — E785 Hyperlipidemia, unspecified: Secondary | ICD-10-CM

## 2023-06-19 LAB — LIPID PANEL
Chol/HDL Ratio: 1.9 ratio (ref 0.0–4.4)
LDL Chol Calc (NIH): 85 mg/dL (ref 0–99)
Triglycerides: 58 mg/dL (ref 0–149)
VLDL Cholesterol Cal: 11 mg/dL (ref 5–40)

## 2023-06-21 ENCOUNTER — Telehealth: Payer: Self-pay

## 2023-06-21 ENCOUNTER — Other Ambulatory Visit: Payer: Self-pay | Admitting: *Deleted

## 2023-06-21 DIAGNOSIS — Z79899 Other long term (current) drug therapy: Secondary | ICD-10-CM

## 2023-06-21 DIAGNOSIS — E785 Hyperlipidemia, unspecified: Secondary | ICD-10-CM

## 2023-06-21 LAB — LIPOPROTEIN A (LPA): Lipoprotein (a): <8.4 nmol/L (ref <75.0)

## 2023-06-21 LAB — LIPID PANEL
Cholesterol, Total: 198 mg/dL (ref 100–199)
HDL: 102 mg/dL (ref >39)

## 2023-06-21 MED ORDER — ROSUVASTATIN CALCIUM 40 MG PO TABS: 40.0000 mg | ORAL_TABLET | Freq: Every day | ORAL | 3 refills | Status: DC

## 2023-06-21 NOTE — Telephone Encounter (Signed)
Spoke with patient over mychart. Patient agrees to higher dose of crestor at 40 mg, would like to use her 20 mg supply for the next month to see if she tolerates the higher dose. Labs (ALT/FLP)  ordered for 3 months out.

## 2023-07-06 DIAGNOSIS — Z01419 Encounter for gynecological examination (general) (routine) without abnormal findings: Secondary | ICD-10-CM | POA: Diagnosis not present

## 2023-07-06 DIAGNOSIS — Z8262 Family history of osteoporosis: Secondary | ICD-10-CM | POA: Diagnosis not present

## 2023-07-06 DIAGNOSIS — M8588 Other specified disorders of bone density and structure, other site: Secondary | ICD-10-CM | POA: Diagnosis not present

## 2023-07-07 ENCOUNTER — Other Ambulatory Visit: Payer: Self-pay | Admitting: Obstetrics and Gynecology

## 2023-07-07 DIAGNOSIS — Z1231 Encounter for screening mammogram for malignant neoplasm of breast: Secondary | ICD-10-CM

## 2023-07-14 ENCOUNTER — Other Ambulatory Visit: Payer: Self-pay | Admitting: Nurse Practitioner

## 2023-07-14 DIAGNOSIS — M8588 Other specified disorders of bone density and structure, other site: Secondary | ICD-10-CM

## 2023-07-19 ENCOUNTER — Ambulatory Visit
Admission: RE | Admit: 2023-07-19 | Discharge: 2023-07-19 | Disposition: A | Discharge status: Home / Self Care | Payer: HMO | Source: Ambulatory Visit | Attending: Obstetrics and Gynecology | Admitting: Obstetrics and Gynecology

## 2023-07-19 DIAGNOSIS — Z1231 Encounter for screening mammogram for malignant neoplasm of breast: Secondary | ICD-10-CM

## 2023-07-28 ENCOUNTER — Other Ambulatory Visit: Payer: Self-pay | Admitting: Cardiology

## 2023-08-02 DIAGNOSIS — U071 COVID-19: Secondary | ICD-10-CM | POA: Diagnosis not present

## 2023-08-02 DIAGNOSIS — R112 Nausea with vomiting, unspecified: Secondary | ICD-10-CM | POA: Diagnosis not present

## 2023-08-07 ENCOUNTER — Ambulatory Visit (INDEPENDENT_AMBULATORY_CARE_PROVIDER_SITE_OTHER): Payer: HMO | Admitting: Psychology

## 2023-08-07 DIAGNOSIS — F4323 Adjustment disorder with mixed anxiety and depressed mood: Secondary | ICD-10-CM | POA: Diagnosis not present
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
## 2023-08-15 ENCOUNTER — Ambulatory Visit (INDEPENDENT_AMBULATORY_CARE_PROVIDER_SITE_OTHER): Payer: HMO | Admitting: Psychology

## 2023-08-15 DIAGNOSIS — F4323 Adjustment disorder with mixed anxiety and depressed mood: Secondary | ICD-10-CM
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
## 2023-08-18 DIAGNOSIS — H52203 Unspecified astigmatism, bilateral: Secondary | ICD-10-CM | POA: Diagnosis not present

## 2023-08-18 DIAGNOSIS — H524 Presbyopia: Secondary | ICD-10-CM | POA: Diagnosis not present

## 2023-08-18 DIAGNOSIS — H2513 Age-related nuclear cataract, bilateral: Secondary | ICD-10-CM | POA: Diagnosis not present

## 2023-08-25 ENCOUNTER — Ambulatory Visit (INDEPENDENT_AMBULATORY_CARE_PROVIDER_SITE_OTHER): Payer: HMO | Admitting: Psychology

## 2023-08-25 DIAGNOSIS — F4323 Adjustment disorder with mixed anxiety and depressed mood: Secondary | ICD-10-CM | POA: Diagnosis not present
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
## 2023-09-01 ENCOUNTER — Ambulatory Visit (INDEPENDENT_AMBULATORY_CARE_PROVIDER_SITE_OTHER): Payer: HMO | Admitting: Psychology

## 2023-09-01 DIAGNOSIS — F4323 Adjustment disorder with mixed anxiety and depressed mood: Secondary | ICD-10-CM
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
## 2023-09-08 ENCOUNTER — Ambulatory Visit: Payer: HMO | Admitting: Psychology

## 2023-09-25 ENCOUNTER — Ambulatory Visit (INDEPENDENT_AMBULATORY_CARE_PROVIDER_SITE_OTHER): Payer: HMO | Admitting: Psychology

## 2023-09-25 DIAGNOSIS — F4323 Adjustment disorder with mixed anxiety and depressed mood: Secondary | ICD-10-CM
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
## 2023-10-10 ENCOUNTER — Ambulatory Visit: Payer: HMO | Admitting: Psychology

## 2023-10-10 DIAGNOSIS — F4323 Adjustment disorder with mixed anxiety and depressed mood: Secondary | ICD-10-CM
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
## 2023-10-17 ENCOUNTER — Encounter: Payer: Self-pay | Admitting: *Deleted

## 2023-10-23 DIAGNOSIS — E7800 Pure hypercholesterolemia, unspecified: Secondary | ICD-10-CM | POA: Diagnosis not present

## 2023-10-23 DIAGNOSIS — E039 Hypothyroidism, unspecified: Secondary | ICD-10-CM | POA: Diagnosis not present

## 2023-10-23 DIAGNOSIS — Z79899 Other long term (current) drug therapy: Secondary | ICD-10-CM | POA: Diagnosis not present

## 2023-10-23 DIAGNOSIS — E559 Vitamin D deficiency, unspecified: Secondary | ICD-10-CM | POA: Diagnosis not present

## 2023-10-24 ENCOUNTER — Ambulatory Visit: Payer: HMO | Admitting: Psychology

## 2023-10-24 DIAGNOSIS — F4323 Adjustment disorder with mixed anxiety and depressed mood: Secondary | ICD-10-CM
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
## 2023-10-25 DIAGNOSIS — Z0000 Encounter for general adult medical examination without abnormal findings: Secondary | ICD-10-CM | POA: Diagnosis not present

## 2023-10-25 DIAGNOSIS — R110 Nausea: Secondary | ICD-10-CM | POA: Diagnosis not present

## 2023-10-25 DIAGNOSIS — F410 Panic disorder [episodic paroxysmal anxiety] without agoraphobia: Secondary | ICD-10-CM | POA: Diagnosis not present

## 2023-10-25 DIAGNOSIS — E039 Hypothyroidism, unspecified: Secondary | ICD-10-CM | POA: Diagnosis not present

## 2023-10-25 DIAGNOSIS — Z681 Body mass index (BMI) 19 or less, adult: Secondary | ICD-10-CM | POA: Diagnosis not present

## 2023-10-25 DIAGNOSIS — F321 Major depressive disorder, single episode, moderate: Secondary | ICD-10-CM | POA: Diagnosis not present

## 2023-10-25 DIAGNOSIS — M25512 Pain in left shoulder: Secondary | ICD-10-CM | POA: Diagnosis not present

## 2023-10-25 DIAGNOSIS — I7121 Aneurysm of the ascending aorta, without rupture: Secondary | ICD-10-CM | POA: Diagnosis not present

## 2023-10-25 DIAGNOSIS — K589 Irritable bowel syndrome without diarrhea: Secondary | ICD-10-CM | POA: Diagnosis not present

## 2023-10-25 DIAGNOSIS — I700 Atherosclerosis of aorta: Secondary | ICD-10-CM | POA: Diagnosis not present

## 2023-10-25 DIAGNOSIS — E559 Vitamin D deficiency, unspecified: Secondary | ICD-10-CM | POA: Diagnosis not present

## 2023-10-25 DIAGNOSIS — Z23 Encounter for immunization: Secondary | ICD-10-CM | POA: Diagnosis not present

## 2023-10-29 DIAGNOSIS — M7582 Other shoulder lesions, left shoulder: Secondary | ICD-10-CM | POA: Diagnosis not present

## 2023-11-14 ENCOUNTER — Ambulatory Visit: Payer: HMO | Admitting: Psychology

## 2023-11-14 DIAGNOSIS — F4323 Adjustment disorder with mixed anxiety and depressed mood: Secondary | ICD-10-CM
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
## 2023-12-04 ENCOUNTER — Ambulatory Visit: Payer: HMO | Admitting: Psychology

## 2023-12-15 ENCOUNTER — Ambulatory Visit (INDEPENDENT_AMBULATORY_CARE_PROVIDER_SITE_OTHER): Payer: HMO | Admitting: Psychology

## 2023-12-15 DIAGNOSIS — F4323 Adjustment disorder with mixed anxiety and depressed mood: Secondary | ICD-10-CM
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
## 2024-01-05 ENCOUNTER — Ambulatory Visit (INDEPENDENT_AMBULATORY_CARE_PROVIDER_SITE_OTHER): Payer: HMO | Admitting: Psychology

## 2024-01-05 DIAGNOSIS — F4323 Adjustment disorder with mixed anxiety and depressed mood: Secondary | ICD-10-CM | POA: Diagnosis not present
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
## 2024-01-26 ENCOUNTER — Ambulatory Visit: Payer: HMO | Admitting: Psychology

## 2024-02-01 ENCOUNTER — Ambulatory Visit: Payer: HMO | Admitting: Psychology

## 2024-02-16 ENCOUNTER — Ambulatory Visit: Payer: HMO | Admitting: Psychology

## 2024-02-16 DIAGNOSIS — F4323 Adjustment disorder with mixed anxiety and depressed mood: Secondary | ICD-10-CM
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
## 2024-03-11 ENCOUNTER — Other Ambulatory Visit: Payer: Self-pay | Admitting: Nurse Practitioner

## 2024-03-11 ENCOUNTER — Other Ambulatory Visit: Payer: Self-pay | Admitting: Family Medicine

## 2024-03-11 DIAGNOSIS — Z1231 Encounter for screening mammogram for malignant neoplasm of breast: Secondary | ICD-10-CM

## 2024-03-11 DIAGNOSIS — M8588 Other specified disorders of bone density and structure, other site: Secondary | ICD-10-CM

## 2024-03-15 ENCOUNTER — Other Ambulatory Visit: Payer: HMO

## 2024-03-19 DIAGNOSIS — M542 Cervicalgia: Secondary | ICD-10-CM | POA: Diagnosis not present

## 2024-03-19 DIAGNOSIS — M9901 Segmental and somatic dysfunction of cervical region: Secondary | ICD-10-CM | POA: Diagnosis not present

## 2024-03-19 DIAGNOSIS — M9902 Segmental and somatic dysfunction of thoracic region: Secondary | ICD-10-CM | POA: Diagnosis not present

## 2024-03-19 DIAGNOSIS — M5032 Other cervical disc degeneration, mid-cervical region, unspecified level: Secondary | ICD-10-CM | POA: Diagnosis not present

## 2024-03-22 DIAGNOSIS — M5032 Other cervical disc degeneration, mid-cervical region, unspecified level: Secondary | ICD-10-CM | POA: Diagnosis not present

## 2024-03-22 DIAGNOSIS — M9902 Segmental and somatic dysfunction of thoracic region: Secondary | ICD-10-CM | POA: Diagnosis not present

## 2024-03-22 DIAGNOSIS — M542 Cervicalgia: Secondary | ICD-10-CM | POA: Diagnosis not present

## 2024-03-22 DIAGNOSIS — M9901 Segmental and somatic dysfunction of cervical region: Secondary | ICD-10-CM | POA: Diagnosis not present

## 2024-03-25 DIAGNOSIS — M542 Cervicalgia: Secondary | ICD-10-CM | POA: Diagnosis not present

## 2024-03-25 DIAGNOSIS — M5032 Other cervical disc degeneration, mid-cervical region, unspecified level: Secondary | ICD-10-CM | POA: Diagnosis not present

## 2024-03-25 DIAGNOSIS — M9901 Segmental and somatic dysfunction of cervical region: Secondary | ICD-10-CM | POA: Diagnosis not present

## 2024-03-25 DIAGNOSIS — M9902 Segmental and somatic dysfunction of thoracic region: Secondary | ICD-10-CM | POA: Diagnosis not present

## 2024-03-27 DIAGNOSIS — M542 Cervicalgia: Secondary | ICD-10-CM | POA: Diagnosis not present

## 2024-03-27 DIAGNOSIS — M9901 Segmental and somatic dysfunction of cervical region: Secondary | ICD-10-CM | POA: Diagnosis not present

## 2024-03-27 DIAGNOSIS — M9902 Segmental and somatic dysfunction of thoracic region: Secondary | ICD-10-CM | POA: Diagnosis not present

## 2024-03-27 DIAGNOSIS — M5032 Other cervical disc degeneration, mid-cervical region, unspecified level: Secondary | ICD-10-CM | POA: Diagnosis not present

## 2024-04-01 DIAGNOSIS — M9902 Segmental and somatic dysfunction of thoracic region: Secondary | ICD-10-CM | POA: Diagnosis not present

## 2024-04-01 DIAGNOSIS — M9901 Segmental and somatic dysfunction of cervical region: Secondary | ICD-10-CM | POA: Diagnosis not present

## 2024-04-01 DIAGNOSIS — M542 Cervicalgia: Secondary | ICD-10-CM | POA: Diagnosis not present

## 2024-04-01 DIAGNOSIS — M5032 Other cervical disc degeneration, mid-cervical region, unspecified level: Secondary | ICD-10-CM | POA: Diagnosis not present

## 2024-04-23 ENCOUNTER — Other Ambulatory Visit: Payer: Self-pay | Admitting: *Deleted

## 2024-04-23 DIAGNOSIS — I7121 Aneurysm of the ascending aorta, without rupture: Secondary | ICD-10-CM

## 2024-04-26 ENCOUNTER — Ambulatory Visit: Admitting: Psychology

## 2024-04-26 DIAGNOSIS — F4323 Adjustment disorder with mixed anxiety and depressed mood: Secondary | ICD-10-CM
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
## 2024-05-22 DIAGNOSIS — D225 Melanocytic nevi of trunk: Secondary | ICD-10-CM | POA: Diagnosis not present

## 2024-05-22 DIAGNOSIS — D2261 Melanocytic nevi of right upper limb, including shoulder: Secondary | ICD-10-CM | POA: Diagnosis not present

## 2024-05-22 DIAGNOSIS — Z85828 Personal history of other malignant neoplasm of skin: Secondary | ICD-10-CM | POA: Diagnosis not present

## 2024-05-22 DIAGNOSIS — L819 Disorder of pigmentation, unspecified: Secondary | ICD-10-CM | POA: Diagnosis not present

## 2024-05-22 DIAGNOSIS — L570 Actinic keratosis: Secondary | ICD-10-CM | POA: Diagnosis not present

## 2024-05-22 DIAGNOSIS — L820 Inflamed seborrheic keratosis: Secondary | ICD-10-CM | POA: Diagnosis not present

## 2024-05-24 ENCOUNTER — Ambulatory Visit (HOSPITAL_COMMUNITY)
Admission: RE | Admit: 2024-05-24 | Discharge: 2024-05-24 | Disposition: A | Discharge status: Home / Self Care | Source: Ambulatory Visit | Attending: Cardiology | Admitting: Cardiology

## 2024-05-24 DIAGNOSIS — I7121 Aneurysm of the ascending aorta, without rupture: Secondary | ICD-10-CM | POA: Diagnosis not present

## 2024-05-24 DIAGNOSIS — J9811 Atelectasis: Secondary | ICD-10-CM | POA: Diagnosis not present

## 2024-05-24 MED ORDER — IOHEXOL 350 MG/ML SOLN: 75.0000 mL | Freq: Once | INTRAVENOUS | Status: AC | PRN

## 2024-05-28 ENCOUNTER — Encounter: Payer: Self-pay | Admitting: Cardiology

## 2024-06-02 ENCOUNTER — Ambulatory Visit: Payer: Self-pay | Admitting: Cardiology

## 2024-06-02 DIAGNOSIS — I7121 Aneurysm of the ascending aorta, without rupture: Secondary | ICD-10-CM

## 2024-06-11 ENCOUNTER — Encounter: Payer: Self-pay | Admitting: Cardiology

## 2024-08-13 ENCOUNTER — Encounter: Payer: Self-pay | Admitting: Sports Medicine

## 2024-08-20 DIAGNOSIS — H2513 Age-related nuclear cataract, bilateral: Secondary | ICD-10-CM | POA: Diagnosis not present

## 2024-09-03 DIAGNOSIS — L814 Other melanin hyperpigmentation: Secondary | ICD-10-CM | POA: Diagnosis not present

## 2024-09-03 DIAGNOSIS — B0221 Postherpetic geniculate ganglionitis: Secondary | ICD-10-CM | POA: Diagnosis not present

## 2024-09-03 DIAGNOSIS — D140 Benign neoplasm of middle ear, nasal cavity and accessory sinuses: Secondary | ICD-10-CM | POA: Diagnosis not present

## 2024-09-24 DIAGNOSIS — B079 Viral wart, unspecified: Secondary | ICD-10-CM | POA: Diagnosis not present

## 2024-09-24 DIAGNOSIS — D140 Benign neoplasm of middle ear, nasal cavity and accessory sinuses: Secondary | ICD-10-CM | POA: Diagnosis not present

## 2024-11-04 DIAGNOSIS — F321 Major depressive disorder, single episode, moderate: Secondary | ICD-10-CM | POA: Diagnosis not present

## 2024-11-04 DIAGNOSIS — E039 Hypothyroidism, unspecified: Secondary | ICD-10-CM | POA: Diagnosis not present

## 2024-11-04 DIAGNOSIS — Z6821 Body mass index (BMI) 21.0-21.9, adult: Secondary | ICD-10-CM | POA: Diagnosis not present

## 2024-11-04 DIAGNOSIS — Z0000 Encounter for general adult medical examination without abnormal findings: Secondary | ICD-10-CM | POA: Diagnosis not present

## 2024-11-04 DIAGNOSIS — E559 Vitamin D deficiency, unspecified: Secondary | ICD-10-CM | POA: Diagnosis not present

## 2024-11-04 DIAGNOSIS — Z1389 Encounter for screening for other disorder: Secondary | ICD-10-CM | POA: Diagnosis not present

## 2024-11-04 DIAGNOSIS — E7800 Pure hypercholesterolemia, unspecified: Secondary | ICD-10-CM | POA: Diagnosis not present

## 2024-11-04 DIAGNOSIS — F419 Anxiety disorder, unspecified: Secondary | ICD-10-CM | POA: Diagnosis not present

## 2024-11-12 DIAGNOSIS — D140 Benign neoplasm of middle ear, nasal cavity and accessory sinuses: Secondary | ICD-10-CM | POA: Diagnosis not present

## 2024-11-12 DIAGNOSIS — R238 Other skin changes: Secondary | ICD-10-CM | POA: Diagnosis not present

## 2024-11-14 ENCOUNTER — Other Ambulatory Visit

## 2024-11-14 ENCOUNTER — Ambulatory Visit (HOSPITAL_BASED_OUTPATIENT_CLINIC_OR_DEPARTMENT_OTHER)
Admission: RE | Admit: 2024-11-14 | Discharge: 2024-11-14 | Disposition: A | Discharge status: Home / Self Care | Source: Ambulatory Visit | Attending: Nurse Practitioner | Admitting: Nurse Practitioner

## 2024-11-14 ENCOUNTER — Ambulatory Visit

## 2024-11-14 ENCOUNTER — Inpatient Hospital Stay: Admission: RE | Admit: 2024-11-14 | Discharge: 2024-11-14 | Attending: Family Medicine | Admitting: Family Medicine

## 2024-11-14 DIAGNOSIS — M8588 Other specified disorders of bone density and structure, other site: Secondary | ICD-10-CM | POA: Diagnosis not present

## 2024-11-14 DIAGNOSIS — Z1231 Encounter for screening mammogram for malignant neoplasm of breast: Secondary | ICD-10-CM

## 2024-11-14 DIAGNOSIS — M8589 Other specified disorders of bone density and structure, multiple sites: Secondary | ICD-10-CM | POA: Diagnosis not present

## 2024-11-14 DIAGNOSIS — Z780 Asymptomatic menopausal state: Secondary | ICD-10-CM | POA: Diagnosis not present

## 2024-12-24 ENCOUNTER — Other Ambulatory Visit: Payer: Self-pay

## 2024-12-24 DIAGNOSIS — Z860100 Personal history of colon polyps, unspecified: Secondary | ICD-10-CM | POA: Insufficient documentation

## 2024-12-24 DIAGNOSIS — K5730 Diverticulosis of large intestine without perforation or abscess without bleeding: Secondary | ICD-10-CM | POA: Insufficient documentation

## 2024-12-24 DIAGNOSIS — K900 Celiac disease: Secondary | ICD-10-CM | POA: Insufficient documentation

## 2024-12-24 DIAGNOSIS — E559 Vitamin D deficiency, unspecified: Secondary | ICD-10-CM | POA: Insufficient documentation

## 2024-12-24 DIAGNOSIS — F410 Panic disorder [episodic paroxysmal anxiety] without agoraphobia: Secondary | ICD-10-CM | POA: Insufficient documentation

## 2024-12-24 DIAGNOSIS — N9489 Other specified conditions associated with female genital organs and menstrual cycle: Secondary | ICD-10-CM | POA: Insufficient documentation

## 2024-12-24 DIAGNOSIS — Z800 Family history of malignant neoplasm of digestive organs: Secondary | ICD-10-CM | POA: Insufficient documentation

## 2024-12-24 DIAGNOSIS — F418 Other specified anxiety disorders: Secondary | ICD-10-CM | POA: Insufficient documentation

## 2024-12-24 DIAGNOSIS — F329 Major depressive disorder, single episode, unspecified: Secondary | ICD-10-CM | POA: Insufficient documentation

## 2024-12-24 DIAGNOSIS — R1310 Dysphagia, unspecified: Secondary | ICD-10-CM | POA: Insufficient documentation

## 2024-12-24 DIAGNOSIS — Z85828 Personal history of other malignant neoplasm of skin: Secondary | ICD-10-CM | POA: Insufficient documentation

## 2024-12-24 DIAGNOSIS — K219 Gastro-esophageal reflux disease without esophagitis: Secondary | ICD-10-CM | POA: Insufficient documentation

## 2024-12-24 DIAGNOSIS — K9041 Non-celiac gluten sensitivity: Secondary | ICD-10-CM | POA: Insufficient documentation

## 2024-12-24 DIAGNOSIS — E079 Disorder of thyroid, unspecified: Secondary | ICD-10-CM | POA: Insufficient documentation

## 2024-12-24 DIAGNOSIS — K2100 Gastro-esophageal reflux disease with esophagitis, without bleeding: Secondary | ICD-10-CM | POA: Insufficient documentation

## 2024-12-24 DIAGNOSIS — I700 Atherosclerosis of aorta: Secondary | ICD-10-CM | POA: Insufficient documentation

## 2024-12-24 DIAGNOSIS — U071 COVID-19: Secondary | ICD-10-CM | POA: Insufficient documentation

## 2024-12-24 DIAGNOSIS — K589 Irritable bowel syndrome without diarrhea: Secondary | ICD-10-CM | POA: Insufficient documentation

## 2024-12-24 DIAGNOSIS — M792 Neuralgia and neuritis, unspecified: Secondary | ICD-10-CM | POA: Insufficient documentation

## 2024-12-25 NOTE — Progress Notes (Signed)
 " Cardiology Office Note:   Date:  12/26/2024  ID:  [name], DOB 10-15-1956, MRN [id number] PCP: Okey Carlin Redbird, MD  [facility] Providers Cardiologist:  Lynwood Schilling, MD {  History of Present Illness:   [name] is a 69 y.o. female who presents for evaluation of chest pain.   She had a CT cardiac score in August 2015 that showed calcium  score of 100 which placed her at 67 percentile for age and sex matched control, she also had mildly dilated aortic root measuring 42 mm.  She had a cardiac catheterization in 2015 that demonstrated no coronary artery disease but there was some calcification proximally in the LAD.  Echocardiogram obtained on 07/13/2014 showed EF 55 to 60%, the echocardiogram did not demonstrate any aortic root enlargement.  She also has a history of palpitation.  In 2019 she had chest pain and palpitations and she had a low risk stress test.  She had a increased size of her aorta on CT of 45 mm.   Most recent CT in 2025 demonstrated the aorta to be 45 mm.  She had rare atrial tach on Holter.     She had chest pain in 2024 and had a coronary CT with mild non obstructive plaque.     Since she was last seen she has stopped her job.  She has much less stress.  She is going to spin class.  She exercises.  She denies any cardiovascular symptoms.  She does have a low heart rate and she tolerates this without presyncope or syncope.  She has no chest pressure, neck or arm discomfort.  She has no weight gain or edema.  Darol, one of her 2 sons, is about to have surgery for treatment of coarctation.  He may or may not have to have valvular surgery.  He had congenital heart disease.  He is living in [place] .  Her other son is a surveyor, minerals in [place].  ROS: As stated in the HPI and negative for all other systems.  Studies Reviewed:    EKG:   EKG Interpretation Date/Time:  Thursday December 26 2024 08:55:59 EST Ventricular Rate:  48 PR Interval:  194 QRS Duration:  86 QT  Interval:  456 QTC Calculation: 407 R Axis:   67  Text Interpretation: Sinus bradycardia Nonspecific T wave abnormality When compared with ECG of 01-Jun-2023 08:24, No significant change was found Confirmed by Schilling Rattan ([id number]) on 12/26/2024 9:24:25 AM     Risk Assessment/Calculations:              Physical Exam:   VS:  BP 118/78   Pulse (!) 48   Ht 5' 5 (1.651 m)   Wt 130 lb 6.4 oz (59.1 kg)   SpO2 95%   BMI 21.70 kg/m    Wt Readings from Last 3 Encounters:  12/26/24 130 lb 6.4 oz (59.1 kg)  06/01/23 120 lb 9.6 oz (54.7 kg)  05/15/23 125 lb (56.7 kg)     GEN: Well nourished, well developed in no acute distress NECK: No JVD; No carotid bruits CARDIAC: RRR, no murmurs, rubs, gallops RESPIRATORY:  Clear to auscultation without rales, wheezing or rhonchi  ABDOMEN: Soft, non-tender, non-distended EXTREMITIES:  No edema; No deformity   ASSESSMENT AND PLAN:   Chest pain: The patient's had no further chest discomfort.  She does have some coronary calcium  with only mild coronary plaque.  She wants to participate more lifestyle in risk reduction.  Ascending aortic  and aneurysm:     This was 45 mm on the CT 09/15/24.  She will have follow-up this year.   HTN:   The blood pressure is at target.  No change in therapy.  Dyslipidemia:    LDL was mildly above target a couple of years ago at 53.  However, her HDL was 89.  She does not want statin therapy if not absolutely indicated.  I will check a lipid profile again today.     Follow up with me in 1 year  Signed, Lynwood Schilling, MD   "

## 2024-12-26 ENCOUNTER — Encounter: Payer: Self-pay | Admitting: Cardiology

## 2024-12-26 ENCOUNTER — Ambulatory Visit: Admitting: Cardiology

## 2024-12-26 VITALS — BP 118/78 | HR 48 | Ht 65.0 in | Wt 130.4 lb

## 2024-12-26 DIAGNOSIS — I7121 Aneurysm of the ascending aorta, without rupture: Secondary | ICD-10-CM

## 2024-12-26 DIAGNOSIS — R072 Precordial pain: Secondary | ICD-10-CM | POA: Diagnosis not present

## 2024-12-26 DIAGNOSIS — I10 Essential (primary) hypertension: Secondary | ICD-10-CM | POA: Diagnosis not present

## 2024-12-26 DIAGNOSIS — E785 Hyperlipidemia, unspecified: Secondary | ICD-10-CM

## 2024-12-26 LAB — LIPID PANEL
Chol/HDL Ratio: 2.8 ratio (ref 0.0–4.4)
Cholesterol, Total: 213 mg/dL — ABNORMAL HIGH (ref 100–199)
HDL: 76 mg/dL
LDL Chol Calc (NIH): 128 mg/dL — ABNORMAL HIGH (ref 0–99)
Triglycerides: 53 mg/dL (ref 0–149)
VLDL Cholesterol Cal: 9 mg/dL (ref 5–40)

## 2024-12-26 NOTE — Patient Instructions (Signed)
 Medication Instructions:  Your physician recommends that you continue on your current medications as directed. Please refer to the Current Medication list given to you today.  *If you need a refill on your cardiac medications before your next appointment, please call your pharmacy*  Lab Work: Lipid panel today at [facility] If you have labs (blood work) drawn today and your tests are completely normal, you will receive your results only by: MyChart Message (if you have MyChart) OR A paper copy in the mail If you have any lab test that is abnormal or we need to change your treatment, we will call you to review the results.  Testing/Procedures: NONE  Follow-Up: At [facility], you and your health needs are our priority.  As part of our continuing mission to provide you with exceptional heart care, our providers are all part of one team.  This team includes your primary Cardiologist (physician) and Advanced Practice Providers or APPs (Physician Assistants and Nurse Practitioners) who all work together to provide you with the care you need, when you need it.  Your next appointment:   1 year(s)  Provider:   Lynwood Schilling, MD    We recommend signing up for the patient portal called MyChart.  Sign up information is provided on this After Visit Summary.  MyChart is used to connect with patients for Virtual Visits (Telemedicine).  Patients are able to view lab/test results, encounter notes, upcoming appointments, etc.  Non-urgent messages can be sent to your provider as well.   To learn more about what you can do with MyChart, go to [email or web address].

## 2024-12-28 ENCOUNTER — Ambulatory Visit: Payer: Self-pay | Admitting: Cardiology
